# Patient Record
Sex: Female | Born: 1960 | Race: White | Hispanic: No | Marital: Married | Smoking: Former smoker
Health system: Southern US, Community
[De-identification: ages and names within clinical notes are randomized; demographics above are authoritative.]

## PROBLEM LIST (undated history)

## (undated) DIAGNOSIS — G2581 Restless legs syndrome: Secondary | ICD-10-CM

## (undated) DIAGNOSIS — G479 Sleep disorder, unspecified: Secondary | ICD-10-CM

## (undated) DIAGNOSIS — I10 Essential (primary) hypertension: Secondary | ICD-10-CM

## (undated) DIAGNOSIS — E119 Type 2 diabetes mellitus without complications: Secondary | ICD-10-CM

## (undated) DIAGNOSIS — Z8679 Personal history of other diseases of the circulatory system: Secondary | ICD-10-CM

## (undated) DIAGNOSIS — T8859XA Other complications of anesthesia, initial encounter: Secondary | ICD-10-CM

## (undated) DIAGNOSIS — K760 Fatty (change of) liver, not elsewhere classified: Secondary | ICD-10-CM

## (undated) DIAGNOSIS — F32A Depression, unspecified: Secondary | ICD-10-CM

## (undated) DIAGNOSIS — Z9889 Other specified postprocedural states: Secondary | ICD-10-CM

## (undated) DIAGNOSIS — N200 Calculus of kidney: Secondary | ICD-10-CM

## (undated) DIAGNOSIS — K219 Gastro-esophageal reflux disease without esophagitis: Secondary | ICD-10-CM

## (undated) DIAGNOSIS — Z8742 Personal history of other diseases of the female genital tract: Secondary | ICD-10-CM

## (undated) DIAGNOSIS — T4145XA Adverse effect of unspecified anesthetic, initial encounter: Secondary | ICD-10-CM

## (undated) DIAGNOSIS — Q829 Congenital malformation of skin, unspecified: Secondary | ICD-10-CM

## (undated) DIAGNOSIS — IMO0002 Reserved for concepts with insufficient information to code with codable children: Secondary | ICD-10-CM

## (undated) DIAGNOSIS — F419 Anxiety disorder, unspecified: Secondary | ICD-10-CM

## (undated) DIAGNOSIS — R112 Nausea with vomiting, unspecified: Secondary | ICD-10-CM

## (undated) DIAGNOSIS — F329 Major depressive disorder, single episode, unspecified: Secondary | ICD-10-CM

## (undated) HISTORY — DX: Personal history of other diseases of the circulatory system: Z86.79

## (undated) HISTORY — PX: TUBAL LIGATION: SHX77

## (undated) HISTORY — PX: CHOLECYSTECTOMY: SHX55

## (undated) HISTORY — PX: NECK SURGERY: SHX720

## (undated) HISTORY — PX: ANKLE SURGERY: SHX546

## (undated) HISTORY — DX: Type 2 diabetes mellitus without complications: E11.9

## (undated) HISTORY — PX: CERVICAL FUSION: SHX112

## (undated) HISTORY — PX: LEG SURGERY: SHX1003

## (undated) HISTORY — PX: BREAST ENHANCEMENT SURGERY: SHX7

## (undated) HISTORY — DX: Reserved for concepts with insufficient information to code with codable children: IMO0002

## (undated) HISTORY — DX: Essential (primary) hypertension: I10

## (undated) HISTORY — DX: Restless legs syndrome: G25.81

## (undated) HISTORY — PX: CARPAL TUNNEL RELEASE: SHX101

## (undated) HISTORY — DX: Personal history of other diseases of the female genital tract: Z87.42

## (undated) HISTORY — DX: Fatty (change of) liver, not elsewhere classified: K76.0

## (undated) HISTORY — PX: WRIST SURGERY: SHX841

---

## 1998-11-10 ENCOUNTER — Emergency Department (HOSPITAL_COMMUNITY): Admission: EM | Admit: 1998-11-10 | Discharge: 1998-11-10 | Payer: Self-pay | Admitting: Emergency Medicine

## 2000-05-16 ENCOUNTER — Encounter: Payer: Self-pay | Admitting: Emergency Medicine

## 2000-05-16 ENCOUNTER — Emergency Department (HOSPITAL_COMMUNITY): Admission: EM | Admit: 2000-05-16 | Discharge: 2000-05-17 | Payer: Self-pay | Admitting: Emergency Medicine

## 2002-07-29 ENCOUNTER — Encounter: Payer: Self-pay | Admitting: Orthopedic Surgery

## 2002-07-29 ENCOUNTER — Inpatient Hospital Stay (HOSPITAL_COMMUNITY): Admission: EM | Admit: 2002-07-29 | Discharge: 2002-08-01 | Payer: Self-pay | Admitting: Emergency Medicine

## 2002-07-29 ENCOUNTER — Encounter: Payer: Self-pay | Admitting: Emergency Medicine

## 2002-07-30 ENCOUNTER — Encounter: Payer: Self-pay | Admitting: Orthopedic Surgery

## 2003-12-13 ENCOUNTER — Other Ambulatory Visit: Admission: RE | Admit: 2003-12-13 | Discharge: 2003-12-13 | Payer: Self-pay | Admitting: Gynecology

## 2004-02-15 ENCOUNTER — Inpatient Hospital Stay (HOSPITAL_COMMUNITY): Admission: EM | Admit: 2004-02-15 | Discharge: 2004-02-16 | Payer: Self-pay | Admitting: Emergency Medicine

## 2008-09-25 ENCOUNTER — Ambulatory Visit (HOSPITAL_COMMUNITY): Admission: RE | Admit: 2008-09-25 | Discharge: 2008-09-28 | Payer: Self-pay | Admitting: Neurosurgery

## 2008-10-15 ENCOUNTER — Encounter: Admission: RE | Admit: 2008-10-15 | Discharge: 2008-10-15 | Payer: Self-pay | Admitting: Neurosurgery

## 2008-11-06 ENCOUNTER — Encounter: Admission: RE | Admit: 2008-11-06 | Discharge: 2008-11-06 | Payer: Self-pay | Admitting: Neurosurgery

## 2008-11-26 ENCOUNTER — Encounter: Admission: RE | Admit: 2008-11-26 | Discharge: 2008-11-26 | Payer: Self-pay | Admitting: Neurosurgery

## 2008-12-07 ENCOUNTER — Ambulatory Visit (HOSPITAL_COMMUNITY): Admission: RE | Admit: 2008-12-07 | Discharge: 2008-12-08 | Payer: Self-pay | Admitting: Neurosurgery

## 2008-12-26 ENCOUNTER — Encounter: Admission: RE | Admit: 2008-12-26 | Discharge: 2008-12-26 | Payer: Self-pay | Admitting: Neurosurgery

## 2009-08-26 IMAGING — RF DG CERVICAL SPINE 1V
1 series · 2 of 2 positions shown · non-contrast
Comparison: Cervical spine radiographs 02/16/2004.

CLINICAL DATA: C5-C6, C6-C7 ACDF.

CERVICAL SPINE - 1 VIEW

[Series 1: run · 2 of 2 slices shown]
[im 1/2]
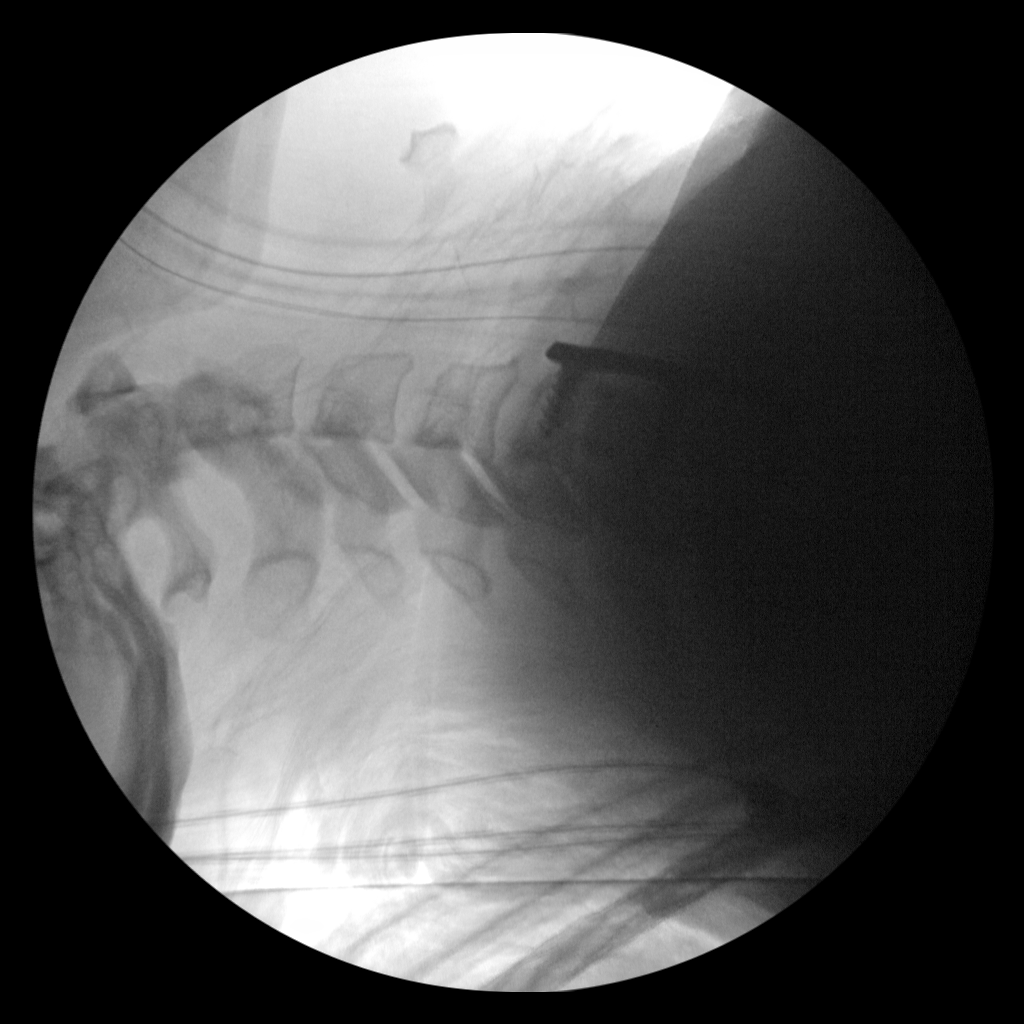
[im 2/2]
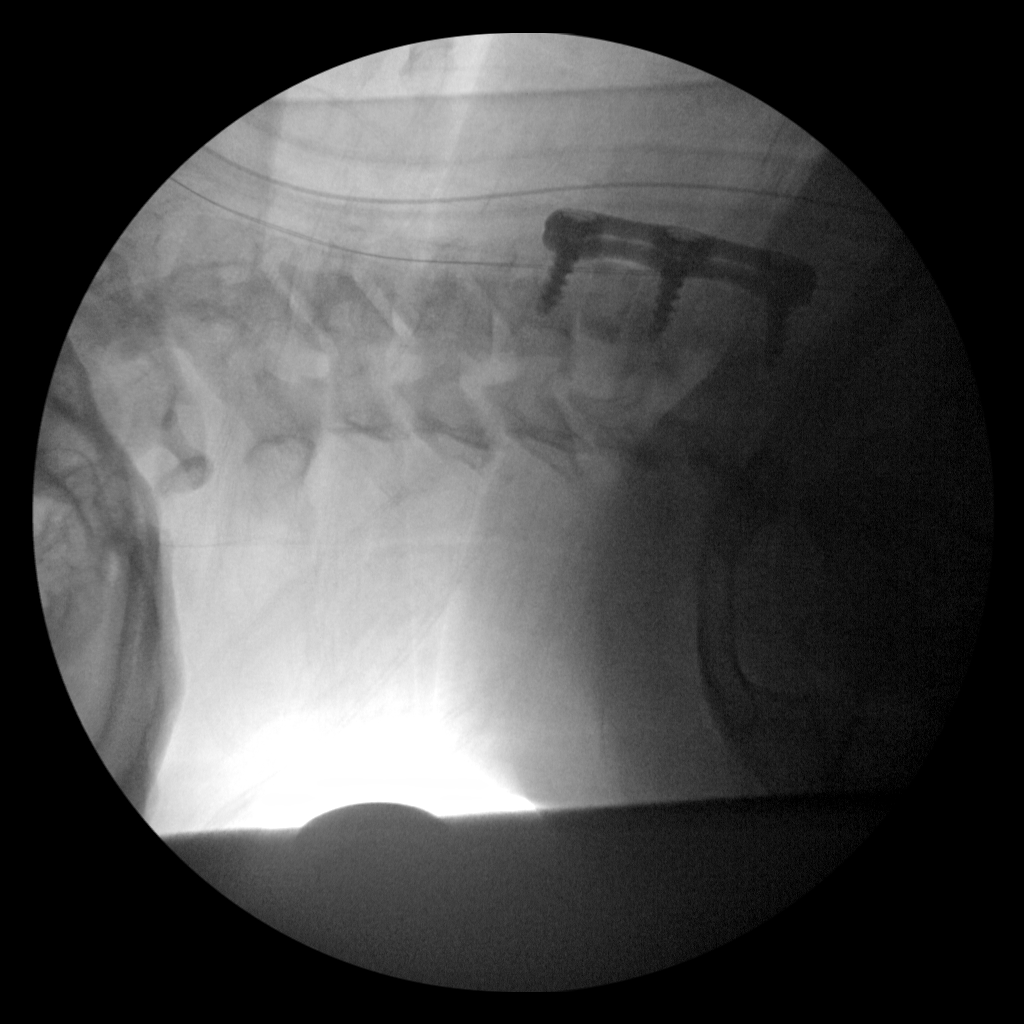

[2 of 2 positions shown; findings below may reference images not displayed]

FINDINGS: Two cross-table lateral views are submitted
postoperatively from the operating room. These demonstrate anterior
discectomy and fusion from C5-C7 with an anterior plate and screws
and intervertebral bone plugs.  The hardware appears well
positioned.  No complications are evident.
IMPRESSION: Intraoperative views following C5-C7 ACDF.  No demonstrated
complication.

## 2010-12-07 ENCOUNTER — Encounter: Payer: Self-pay | Admitting: Family Medicine

## 2011-03-02 LAB — BASIC METABOLIC PANEL
CO2: 28 mEq/L (ref 19–32)
Calcium: 10.2 mg/dL (ref 8.4–10.5)
Creatinine, Ser: 0.69 mg/dL (ref 0.4–1.2)
GFR calc Af Amer: >60 mL/min (ref >60)
GFR calc non Af Amer: >60 mL/min (ref >60)

## 2011-03-02 LAB — CBC
MCHC: 34 g/dL (ref 30.0–36.0)
RBC: 4.6 MIL/uL (ref 3.87–5.11)

## 2011-03-31 NOTE — Op Note (Signed)
[name], [name] NO.:  [id number]   MEDICAL RECORD NO.:  [id number]          PATIENT TYPE:  OIB   LOCATION:  3534                         FACILITY:  [facility]   PHYSICIAN:  Reinaldo Meeker, M.D. DATE OF BIRTH:  1961-02-09   DATE OF PROCEDURE:  12/07/2008  DATE OF DISCHARGE:                               OPERATIVE REPORT   PREOPERATIVE DIAGNOSIS:  Recurrent right C7 radiculopathy.   POSTOPERATIVE DIAGNOSIS:  Recurrent right C7 radiculopathy.   PROCEDURE:  Removal of anterior cervical hardware C5-6, C6-7 with  evaluation of C5-6, C6-7 anterior cervical fusion, redo C6-7 anterior  cervical diskectomy with fusion at C6-7 followed by replacement of C5-6,  C6-7 anterior cervical plating with Helix anterior cervical plate.   SURGEON:  Reinaldo Meeker, MD   ASSISTANT:  Kathaleen Maser. Pool, MD   SURGICAL INDICATIONS:  [name] is a 50 year old female who is  approximately 2 months status post anterior cervical diskectomy with  fusion and plating C5-6, C6-7.  Immediately after surgery, she had a  severe left dysesthetic pain, and an MRI scan done at that time showed a  spinal cord contusion behind the C5-6 interspace.  At that time, the  foramen bilaterally looked good.  Overtime, her dysesthetic left upper  extremity pain has improved, but over the last few weeks she developed  fairly significant right upper extremity pain.  She was evaluated with  an MRI scan and myelogram and CT scan which showed recurrent compression  of the C7 nerve root on the right.  It was therefore elected to be re-  explore the C6-7 fusion at this time and evaluate the C7 nerve root for  further decompression.  The patient understands the risks and benefits  of surgical intervention including bleeding, infection, weakness,  numbness, paralysis, spinal cord leak, trouble with the instrumentation,  nonunion, persistent pain, coma, and death.  We also discussed the  possibility of hoarseness.  We  discussed the option of doing another  intervention.  She has had the opportunity to ask numerous questions and  appears to understand.  With this information at hand, she has requested  to proceed with surgery and she was brought in the hospital at this time  for a surgical procedure.   PROCEDURE IN DETAIL:  After placing in the supine position under 5  pounds halter traction, the patient's neck was prepped and draped in the  usual sterile fashion.  Previous cervical incision was reopened.  Natural fascial plane between the strap muscles medially and  sternocleidomastoid laterally was identified and followed down to the  anterior aspect of the cervical spine.  Longus coli muscles were  identified, split in the midline.  Soft tissue over the plate was  dissected free, and the plate was cleared with unipolar coagulation.  Using the locking device, the screws were removed from the anterior  cervical plate.  The plate was removed without difficulty.  A disk space  spreader was used at C6-7 to help loosen the bone plug, but was already  fairly attached vertically to the C6 vertebral body.  We  therefore  elected to drill it out.  This was done without difficulty.  C5-6 fusion  was found to be quite solid at this time.  The microscope was draped,  brought into the field, and used for the remainder of the case.  A small  rim of cortical bone deep was removed as well as some soft tissue along  the spinal dura.  Exploration of the C6-7 foramen yielded some soft  tissue granulation tissue as well as some looseness of the uncovertebral  process, and these were all removed until the C7 nerve root was  completely decompressed and visualized.  Spinal dura was decompressed.  We did not explore the left C7 foramen.  At this time, measurements were  taken and the bone bank plugs reconstituted.  The area was irrigated  once more and confirming hemostasis.  The plugs were passed off without  difficulty.   Fluoroscopy showed to be in excellent position.  The  previous anterior cervical plate was then just replaced.  Rescue screws  were placed, one on the top and one on the bottom and then the other  screws were just standard fixed angle screws.  The locking mechanism was  secured bilaterally at all 3 levels.  Final fluoroscopy showed the plate  and screws and plug to be in good position.  Large amounts of irrigation  were carried out.  Any bleeding controlled with bipolar coagulation and  Gelfoam.  Gelfoam was removed, and the wound closed with interrupted  Vicryl on the platysma muscle and inverted 5-0 PDS on the subcuticular  layer and Steri-Strips on the skin.  Sterile dressing and soft collar  were applied.  The patient was extubated and taken to recovery room in  stable condition.           ______________________________  Reinaldo Meeker, M.D.     ROK/MEDQ  D:  12/07/2008  T:  12/07/2008  Job:  [id number]

## 2011-03-31 NOTE — Op Note (Signed)
[name], [name] NO.:  [id number]   MEDICAL RECORD NO.:  [id number]          PATIENT TYPE:  INP   LOCATION:  2899                         FACILITY:  [facility]   PHYSICIAN:  Reinaldo Meeker, M.D. DATE OF BIRTH:  04-10-1961   DATE OF PROCEDURE:  09/25/2008  DATE OF DISCHARGE:                               OPERATIVE REPORT   PREOPERATIVE DIAGNOSIS:  Herniated disk, C5-C6, C6-C7, right.   POSTOPERATIVE DIAGNOSIS:  Herniated disk, C5-C6, C6-C7, right.   PROCEDURE:  C5-C6, C6-C7 anterior cervical diskectomy with bone-bank  fusion followed by Helix anterior cervical plating.   SURGEON:  Reinaldo Meeker, MD   ASSISTANT:  Kathaleen Maser. Pool, MD   PROCEDURE IN DETAIL:  After being placed in a supine position in 5-pound  halter traction, the patient's neck was prepped and draped in the usual  sterile fashion.  Localizing fluoroscopy was used prior to incision to  identify the appropriate level.  Transverse incision was made in the  right anterior neck starting at the midline and headed towards the  medial aspect of the sternocleidomastoid muscle.  The platysma muscle  was incised transversely.  The natural fascial plane between the strap  muscles medially and sternocleidomastoid laterally was identified and  followed down to the anterior aspect of the cervical spine.  Longus  colli muscle were identified, split in the midline, stripped away  bilaterally with Tour[employer] information centre manager.  Self-retaining  retractor was placed for exposure.  An x-ray showed the approach to be  at appropriate level.  Using the #15 blade, the disk at C5-C6 and C6-C7  was incised.  Using pituitary rongeurs and curettes, approximately 90%  of disk material was removed.  High-speed drill was used to widen the  interspace.  Microscope was draped, brought into field and used for  remainder of the case.  Starting at C5-C6, the remainder of the disk  material on the posterior longitudinal  ligament was removed.  The  ligament was then incised transversely and cut edges were removed with  Kerrison punch.  Bony overgrowth and herniated disk material along the  C5-C6 level was removed to decompress the spinal dura.  Proximal  foraminal decompression was carried out bilaterally and even more  decompression was carried on the patient's right symptomatic side.  At  this time, inspection was carried out at this level for any evidence of  residual compression, none could be identified.  Attention was then  turned to C6-C7 where similar procedure was carried out.  Once again,  residual bone and herniated disk material were removed to decompress the  spinal dura and proximal foramen bilaterally.  Once again, aggressive  decompression was carried out towards the right symptomatic side.  At  this time, inspection was carried out at both levels for any evidence of  residual compression, none could be identified.  At this time, large  amounts of irrigation were carried out.  Any bleeding was controlled  with bipolar coagulation and Gelfoam.  Measurements were taken and an 8-  mm bone-bank plug was reconstituted and packed at C5-C6  and a 7-mm plug  was impacted at C6-C7.  Fluoroscopy showed them to be in excellent  position.  Large amounts of irrigation were carried out.  A 42-mm Helix  anterior cervical plate was then chosen.  Under fluoroscopic guidance,  drill holes were placed followed by placement of 13-mm screws x6 to the  locking mechanism and was secured at all 3 levels bilaterally.  Final  fluoroscopy showed the plate, screws, and plugs to all be in good  position.  Large amounts of irrigation were carried out once more and  any bleeding was controlled with bipolar coagulation.  The wound was  then closed with interrupted Vicryl on the platysma muscle, inverted 5-0  PDS in the subcuticular layer, and Steri-Strips on the skin.  A sterile  dressing and soft collar were applied and  the patient was extubated and  taken to the recovery room in stable condition.           ______________________________  Reinaldo Meeker, M.D.     ROK/MEDQ  D:  09/25/2008  T:  09/25/2008  Job:  [id number]

## 2011-04-03 NOTE — H&P (Signed)
[name], [name]                          ACCOUNT NO.:  [id number]   MEDICAL RECORD NO.:  [id number]                   PATIENT TYPE:  INP   LOCATION:  1610                                 FACILITY[facility]   PHYSICIAN:  Renato Battles, M.D.                  DATE OF BIRTH:  02/26/1961   DATE OF ADMISSION:  02/14/2004  DATE OF DISCHARGE:                                HISTORY & PHYSICAL   REASON FOR ADMISSION:  Altered mental status.   HISTORY OF PRESENT ILLNESS:  The patient is a 50 year old white female who  has just lost her job today.  She went out with her friends to get some  distraction.  She drank alcohol excessively.  Her friends called her husband  and claimed that she had a seizure.  They later denied that she had any  seizure but she was very combative and confused.  She was given Geodon and  Narcan after which she was very sedated and very hard to wake up, and even  when she was awakened she was very combative and gave no history.  Her blood  pressure was noted to be low and dropped from 100 at arrival to 80s later on  so IV fluids were started.  Her husband denies any suicidal ideation or  depression.  She is taking one medication called Micardis.  However, she  shares that medication with her husband and it is very difficult to know how  many tablets are supposed to be remaining.  Her husband reports that she had  similar event before and she was admitted to this hospital with the same  problem, drinking excessively and being confused and out of it later on.   REVIEW OF SYSTEMS:  Obtained from the husband, denies any other complaints.  Was only positive for history of binge drinking and history of snoring  without apnea.   PAST MEDICAL HISTORY:  1. Hypertension.  2. Anxiety.   PAST SURGICAL HISTORY:  1. Ankle surgery secondary to fracture.  2. Cholecystectomy.   FAMILY HISTORY:  Positive for coronary artery disease.   SOCIAL HISTORY:  Positive for binge  drinking.  Tobacco abuse - one-half pack  a day for the last 25 years.  Occasional cocaine smoking, no IV.   ALLERGIES:  CODEINE.   MEDICATIONS:  Micardis and Effexor - unknown doses.   PHYSICAL EXAMINATION:  GENERAL:  She is very drowsy, snoring, very difficult  to wake up, and when she starts to wake up she is combative, trying to pull  off her IV line and tried to get off the bed.  VITAL SIGNS:  Afebrile, blood pressure at this 80/40, O2 saturation 95% on 2  L oxygen nasal cannula, respiratory rate 14, heart rate fluctuates between  95 and 110.  HEENT:  The head is atraumatic and normocephalic.  Pupils are equal, round,  and react to light and  accommodation.  Could not examine extraocular  movements.  NECK:  No lymphadenopathy, no thyromegaly, no JVD.  CHEST:  Clear to auscultation bilaterally, no wheezing.  HEART:  Regular rhythm, tachycardia.  ABDOMEN:  Soft, nontender, nondistended, normal active bowel sounds.  EXTREMITIES:  No cyanosis or clubbing.  There is 1+ lower extremity edema  bilaterally.   STUDIES:  CBC showed hemoglobin of 14, white count of 14.4, with 51%  neutrophils, normal platelets.   Electrolytes showed low potassium at 3.1, normal kidney function; everything  else was normal.  Urinalysis was negative.  Urine drug screen was positive  for benzodiazepine.   Alcohol level was very high at 218.  Pregnancy test was negative.  Liver  functions were within normal limits.  ABG initially showed pH of 7.251 with  PCO2 of 51 and PO2 of 136.  Repeat showed a pH of 7.23 with PCO2 of 48 and  PO2 of 110.  EKG first set showed suspicious inferior ischemia.  The second  set was nonspecific.  Cardiac enzymes were all negative the first set.   ASSESSMENT AND PLAN:  1. Altered mental status.  This is most likely secondary to alcoholism and     alcoholic intoxication, questionable intake of benzodiazepines on top of     alcohol.  I going to monitor the patient in the  intensive care unit.  At     this point there is no need for any antidote.  We expect the patient to     recover within 24 hours.  2. Alcoholism.  The patient will be given thiamine and folate.  I am going     to check magnesium and phosphate level and replenish potassium.  3. Abnormal electrocardiogram.  Again, I doubt that this represented true     cardiac ischemia.  However, she has multiple risk factors including her     family history, her smoking and alcohol history, her body habitus - she     is overweight.  Given these factors I will start her on Lovenox and     aspirin. I am going to check cardiac enzymes and lipids and monitor her     EKG.  4. Hypotension.  Cannot rule out a Micardis overdose although there are no     indications of it from her history.  The patient received 2 L of IV     fluids in the ER.  I am going to continue with IV fluids and vasopressors     will be reserved to any worsening of blood pressure.  5. Stool is guaiac positive. Currently her hemoglobin and hematocrit are     showing to be normal.  This may change with IV hydration.  I have no     indication of GI bleed at this point with normal BUN and creatinine.  I     am going to just monitor her hemoglobin and hematocrit for the next 24     hours.   PRIMARY CARE PHYSICIAN:  Soyla Murphy. Renne Crigler, M.D.                                               Renato Battles, M.D.    SA/MEDQ  D:  02/15/2004  T:  02/15/2004  Job:  [id number]

## 2011-04-03 NOTE — Op Note (Signed)
NAMEROZLYN, YERBY                          ACCOUNT NO.:  [id number]   MEDICAL RECORD NO.:  [id number]                   PATIENT TYPE:  INP   LOCATION:  0483                                 FACILITY:  [facility]   PHYSICIAN:  Almedia Balls. Ranell Patrick, M.D.              DATE OF BIRTH:  09/05/1961   DATE OF PROCEDURE:  DATE OF DISCHARGE:                                 OPERATIVE REPORT   PREOPERATIVE DIAGNOSIS:  Displaced trimalleolar left ankle fracture.   POSTOPERATIVE DIAGNOSIS:  Displaced trimalleolar left ankle fracture.   PROCEDURE:  Open reduction and internal fixation left trimalleolar ankle  fracture.   SURGEON:  Almedia Balls. Ranell Patrick, M.D.   ANESTHESIA:  General anesthesia.   ESTIMATED BLOOD LOSS:  None.   TOURNIQUET TIME:  1 hour.   INSTRUMENT COUNT:  Correct.   COMPLICATIONS:  None.   ANTIBIOTICS:  None.   INDICATIONS:  The patient is a 50 year old female who sustained a fall,  twisting her left ankle two days ago.  The patient was unable to ambulate  after the injury and complained of immediate pain on both medial and lateral  aspects of the ankle.  The patient did have obvious deformity on presenting  to the emergency room.  X-rays demonstrated displaced trimalleolar ankle  fracture.  At this point, it was recommended that the patient have surgery  to stabilize the ankle.  Informed consent was obtained.   DESCRIPTION OF PROCEDURE:  After an adequate level of anesthesia was  achieved and 1 g of Ancef was given preoperatively, the patient was placed  supine on the operating table.  An sterile tourniquet was placed on the left  proximal thigh.  The left leg was then prepped and draped in the usual  sterile fashion.  After exsanguination of the leg using Esmarch bandage, the  tourniquet was elevated to 275 mmHg.  A longitudinal skin incision was  created overlying the fibula.  This was taken sharply down to the periosteum  which was then incised in line with the skin  incision.  Subperiosteal  dissection of the fibular was performed.  The fracture site was irrigated  and debrided of loose soft tissue.  The fracture was anatomically reduced  and held reduced with a clamp while a seven hold one-third tibial plate was  applied to the lateral aspect of the fibula.  This was secured proximally  and distally with three bicortical screws.  Excellent stability of the  fracture was noted at this point.  Attention was directed first to the  medial aspect of the ankle.  A curvilinear incision was created directly  overlying the medial malleolus.  Dissection was carried sharply down to the  medial malleolus which was then displaced and inspection of the medial  aspect of the ankle was performed.  There was noted to be talar lesions.  Thorough irrigation of the joint was performed.  Periosteum about the  fracture site was removed.  The fracture was reduced and held with the  tenaculum clamp and two  4-0 malleolar screws were placed in the medial malleolus under fluoroscopy  guidance.  The ankle was then anatomically reduced.  A cotton test was  performed in the distal fibula.  It was noted to be stable indicating a  stable syndesmosis at this point.  The wounds were closed using 2-0 Vicryl  and staples.  A sterile dressing was applied followed by a short leg splint.  The patient was taken to the recovery room in stable condition.                                               Almedia Balls. Ranell Patrick, M.D.    SRN/MEDQ  D:  07/30/2002  T:  07/30/2002  Job:  [id number]

## 2011-04-03 NOTE — Discharge Summary (Signed)
NAMEMEAH, [name]                          ACCOUNT NO.:  [id number]   MEDICAL RECORD NO.:  [id number]                   PATIENT TYPE:  INP   LOCATION:  0356                                 FACILITY:  [facility]   PHYSICIAN:  Ara D. Tammi Klippel, M.D.                DATE OF BIRTH:  25-Oct-1961   DATE OF ADMISSION:  02/14/2004  DATE OF DISCHARGE:  02/16/2004                                 DISCHARGE SUMMARY   DISCHARGING ATTENDING:  Dr. Tammi Klippel.   PRIMARY CARE PHYSICIAN:  Dr. Sonda Primes. Pharr.   FINAL DIAGNOSES:  1. Acute alcohol intoxication.  2. Hypertension.  3. Depression.  4. Ankle surgery secondary to fracture.  5. Status post cholecystectomy.   FINAL PROCEDURES:  1. Continuous telemetry monitoring showing only sinus rhythm and no acute     arrhythmia's.  2. Serial EKGs showing no ST segment depression or elevation, no T-wave     changes, and development of no pathologic Q waves.   PERTINENT LABORATORY DATA AND OTHER TEST RESULTS:  Serial cardiac enzymes  negative x3.  H&H at 12.8/37 from a maximum of 14.3 and 9.8 with an MCV of  91.2.  Sodium 137, potassium 3.4, chloride 108, carbon dioxide 25, BUN 5,  creatinine 0.7, glucose 92, calcium of 8.6.  Cholesterol 176, triglycerides  243, HDL 20, LDL 17.  Beta hCG negative.  Urine toxicity screen positive for  benzodiazepines, alcohol level at 218 mg/dL on admission.  Fecal occult  blood test still pending as of this date.   SUMMARY OF HOSPITAL COURSE:  The patient is a very pleasant 50 year old  white female with Past Medical History as listed above who was brought in  after having an alcohol binge after losing her job.  Although the patient  did have benzodiazepines in her urine, she adamantly denied any intentional  ingestion.  On the day of discharge, the patient adamantly denies any  suicidal attempts was planned, any suicidal ideation, or homicidal ideation.  She was seen by psychiatry consult who felt that she did have a  depressive  disorder not otherwise specified and that they agreed with the plan to  discharge her with outpatient follow up at [facility] or [facility].  The patient was restarted on her venlafaxine and the patient  had no signs or symptoms of withdrawal from this medication.  The patient  did have one episode of guaiac-positive stools; however, her H&H remained  stable.  There was no acute signs or symptoms of any GI bleed and this will  be further followed up as an outpatient once her acute issues have resolved.   DISCHARGE MEDICATIONS:  1. Venlafaxine ER 150 mg p.o. daily.  2. ___________ Ethelda Chick.  The patient is to resume her prior home dose.   DISCHARGE INSTRUCTIONS:  1. The patient is to take her medications as prescribed.  2. She is to stop abusing alcohol.  3. She is to follow up with [facility] or [facility].  4. She is to follow up with Dr. Renne Crigler in approximately 1 to 2 weeks.                                               Ara D. Tammi Klippel, M.D.    ADM/MEDQ  D:  02/16/2004  T:  02/17/2004  Job:  [id number]   cc:   Soyla Murphy. Renne Crigler, M.D.  [address] [place] [address]  [place]  [place] [address]  Fax: [phone]
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
## 2011-08-12 ENCOUNTER — Other Ambulatory Visit: Payer: Self-pay | Admitting: Unknown Physician Specialty

## 2011-08-12 DIAGNOSIS — E049 Nontoxic goiter, unspecified: Secondary | ICD-10-CM

## 2011-08-12 DIAGNOSIS — E010 Iodine-deficiency related diffuse (endemic) goiter: Secondary | ICD-10-CM

## 2011-08-13 ENCOUNTER — Other Ambulatory Visit: Payer: Self-pay | Admitting: Unknown Physician Specialty

## 2011-08-13 ENCOUNTER — Ambulatory Visit
Admission: RE | Admit: 2011-08-13 | Discharge: 2011-08-13 | Disposition: A | Discharge status: Home / Self Care | Payer: Self-pay | Source: Ambulatory Visit | Attending: Unknown Physician Specialty | Admitting: Unknown Physician Specialty

## 2011-08-13 DIAGNOSIS — Z1231 Encounter for screening mammogram for malignant neoplasm of breast: Secondary | ICD-10-CM

## 2011-08-13 DIAGNOSIS — E010 Iodine-deficiency related diffuse (endemic) goiter: Secondary | ICD-10-CM

## 2011-08-19 LAB — BASIC METABOLIC PANEL
CO2: 29
Calcium: 9.9
GFR calc Af Amer: >60
GFR calc non Af Amer: >60
Potassium: 4.7
Sodium: 144

## 2011-08-19 LAB — CBC
Hemoglobin: 14.8
MCHC: 34.8
RBC: 4.58

## 2011-09-08 ENCOUNTER — Ambulatory Visit: Payer: Self-pay

## 2012-03-14 ENCOUNTER — Other Ambulatory Visit: Payer: Self-pay | Admitting: Family Medicine

## 2012-03-14 DIAGNOSIS — E041 Nontoxic single thyroid nodule: Secondary | ICD-10-CM

## 2012-03-16 ENCOUNTER — Inpatient Hospital Stay: Admission: RE | Admit: 2012-03-16 | Payer: 59 | Source: Ambulatory Visit

## 2012-03-23 ENCOUNTER — Encounter: Payer: Self-pay | Admitting: *Deleted

## 2012-03-23 DIAGNOSIS — Z8679 Personal history of other diseases of the circulatory system: Secondary | ICD-10-CM | POA: Insufficient documentation

## 2012-03-23 DIAGNOSIS — IMO0002 Reserved for concepts with insufficient information to code with codable children: Secondary | ICD-10-CM | POA: Insufficient documentation

## 2012-07-26 ENCOUNTER — Encounter: Payer: Self-pay | Admitting: Cardiology

## 2014-12-10 ENCOUNTER — Encounter (HOSPITAL_COMMUNITY): Payer: Self-pay | Admitting: *Deleted

## 2014-12-10 ENCOUNTER — Emergency Department (HOSPITAL_COMMUNITY): Payer: 59

## 2014-12-10 ENCOUNTER — Telehealth: Payer: Self-pay | Admitting: *Deleted

## 2014-12-10 ENCOUNTER — Emergency Department (HOSPITAL_COMMUNITY)
Admission: EM | Admit: 2014-12-10 | Discharge: 2014-12-10 | Disposition: A | Discharge status: Home / Self Care | Payer: 59 | Attending: Emergency Medicine | Admitting: Emergency Medicine

## 2014-12-10 DIAGNOSIS — R109 Unspecified abdominal pain: Secondary | ICD-10-CM | POA: Diagnosis present

## 2014-12-10 DIAGNOSIS — R630 Anorexia: Secondary | ICD-10-CM | POA: Insufficient documentation

## 2014-12-10 DIAGNOSIS — Z8739 Personal history of other diseases of the musculoskeletal system and connective tissue: Secondary | ICD-10-CM | POA: Insufficient documentation

## 2014-12-10 DIAGNOSIS — R112 Nausea with vomiting, unspecified: Secondary | ICD-10-CM | POA: Diagnosis not present

## 2014-12-10 DIAGNOSIS — N200 Calculus of kidney: Secondary | ICD-10-CM | POA: Diagnosis not present

## 2014-12-10 DIAGNOSIS — Z79899 Other long term (current) drug therapy: Secondary | ICD-10-CM | POA: Insufficient documentation

## 2014-12-10 DIAGNOSIS — I10 Essential (primary) hypertension: Secondary | ICD-10-CM | POA: Diagnosis not present

## 2014-12-10 DIAGNOSIS — Z87891 Personal history of nicotine dependence: Secondary | ICD-10-CM | POA: Insufficient documentation

## 2014-12-10 LAB — CBC WITH DIFFERENTIAL/PLATELET
BASOS PCT: 1 % (ref 0–1)
Basophils Absolute: 0.1 10*3/uL (ref 0.0–0.1)
EOS ABS: 0.1 10*3/uL (ref 0.0–0.7)
Eosinophils Relative: 1 % (ref 0–5)
HCT: 45.2 % (ref 36.0–46.0)
HEMOGLOBIN: 16.3 g/dL — AB (ref 12.0–15.0)
Lymphocytes Relative: 22 % (ref 12–46)
Lymphs Abs: 1.9 10*3/uL (ref 0.7–4.0)
MCH: 32.4 pg (ref 26.0–34.0)
MCHC: 36.1 g/dL — ABNORMAL HIGH (ref 30.0–36.0)
MCV: 89.9 fL (ref 78.0–100.0)
MONOS PCT: 5 % (ref 3–12)
Monocytes Absolute: 0.4 10*3/uL (ref 0.1–1.0)
NEUTROS PCT: 71 % (ref 43–77)
Neutro Abs: 6.2 10*3/uL (ref 1.7–7.7)
Platelets: 284 10*3/uL (ref 150–400)
RBC: 5.03 MIL/uL (ref 3.87–5.11)
RDW: 11.9 % (ref 11.5–15.5)
WBC: 8.6 10*3/uL (ref 4.0–10.5)

## 2014-12-10 LAB — COMPREHENSIVE METABOLIC PANEL
ALBUMIN: 4.3 g/dL (ref 3.5–5.2)
ALT: 35 U/L (ref 0–35)
ANION GAP: 10 (ref 5–15)
AST: 31 U/L (ref 0–37)
Alkaline Phosphatase: 71 U/L (ref 39–117)
BUN: 12 mg/dL (ref 6–23)
CO2: 24 mmol/L (ref 19–32)
CREATININE: 0.93 mg/dL (ref 0.50–1.10)
Calcium: 9.6 mg/dL (ref 8.4–10.5)
Chloride: 104 mmol/L (ref 96–112)
GFR calc Af Amer: 81 mL/min — ABNORMAL LOW (ref >90)
GFR calc non Af Amer: 70 mL/min — ABNORMAL LOW (ref >90)
Glucose, Bld: 143 mg/dL — ABNORMAL HIGH (ref 70–99)
POTASSIUM: 4 mmol/L (ref 3.5–5.1)
SODIUM: 138 mmol/L (ref 135–145)
TOTAL PROTEIN: 8.1 g/dL (ref 6.0–8.3)
Total Bilirubin: 0.9 mg/dL (ref 0.3–1.2)

## 2014-12-10 LAB — URINE MICROSCOPIC-ADD ON

## 2014-12-10 LAB — URINALYSIS, ROUTINE W REFLEX MICROSCOPIC
Bilirubin Urine: NEGATIVE
GLUCOSE, UA: NEGATIVE mg/dL
Ketones, ur: NEGATIVE mg/dL
Nitrite: NEGATIVE
PH: 5.5 (ref 5.0–8.0)
PROTEIN: NEGATIVE mg/dL
Specific Gravity, Urine: 1.021 (ref 1.005–1.030)
Urobilinogen, UA: 0.2 mg/dL (ref 0.0–1.0)

## 2014-12-10 LAB — LIPASE, BLOOD: Lipase: 29 U/L (ref 11–59)

## 2014-12-10 MED ORDER — KETOROLAC TROMETHAMINE 15 MG/ML IJ SOLN
15.0000 mg | Freq: Once | INTRAMUSCULAR | Status: AC
  Administered 2014-12-10: 15 mg via INTRAVENOUS
  Filled 2014-12-10: qty 1

## 2014-12-10 MED ORDER — HYDROMORPHONE HCL 1 MG/ML IJ SOLN
1.0000 mg | Freq: Once | INTRAMUSCULAR | Status: DC
  Filled 2014-12-10: qty 1

## 2014-12-10 MED ORDER — HYDROCODONE-ACETAMINOPHEN 5-325 MG PO TABS: 1.0000 | ORAL_TABLET | ORAL | Status: DC | PRN

## 2014-12-10 MED ORDER — TRAMADOL HCL 50 MG PO TABS: 50.0000 mg | ORAL_TABLET | Freq: Four times a day (QID) | ORAL | Status: DC | PRN

## 2014-12-10 MED ORDER — HYDROMORPHONE HCL 1 MG/ML IJ SOLN
0.5000 mg | Freq: Once | INTRAMUSCULAR | Status: AC
  Administered 2014-12-10: 0.5 mg via INTRAVENOUS
  Filled 2014-12-10: qty 1

## 2014-12-10 MED ORDER — HYDROMORPHONE HCL 1 MG/ML IJ SOLN
1.0000 mg | Freq: Once | INTRAMUSCULAR | Status: AC
  Administered 2014-12-10: 1 mg via INTRAMUSCULAR

## 2014-12-10 MED ORDER — SODIUM CHLORIDE 0.9 % IV BOLUS (SEPSIS)
500.0000 mL | Freq: Once | INTRAVENOUS | Status: AC
  Administered 2014-12-10: 500 mL via INTRAVENOUS

## 2014-12-10 MED ORDER — ONDANSETRON HCL 4 MG/2ML IJ SOLN
4.0000 mg | Freq: Once | INTRAMUSCULAR | Status: AC
  Administered 2014-12-10: 4 mg via INTRAVENOUS
  Filled 2014-12-10: qty 2

## 2014-12-10 NOTE — Telephone Encounter (Signed)
[facility] called to verify prescription for Ultram.

## 2014-12-10 NOTE — Progress Notes (Signed)
  CARE MANAGEMENT ED NOTE 12/10/2014  Patient:  [name],[name]   Account Number:  [id number]  Date Initiated:  12/10/2014  Documentation initiated by:  Radford PaxFERRERO,Saffron Busey  Subjective/Objective Assessment:   [facility] with abdominal pian and emesis.     Subjective/Objective Assessment Detail:     Action/Plan:   Action/Plan Detail:   Anticipated DC Date:  12/10/2014     Status Recommendation to Physician:   Result of Recommendation:    Other ED Services  Consult Working Plan    DC Planning Services  CM consult  Medication Assistance    Choice offered to / List presented to:            Status of service:  Completed, signed off  ED Comments:   ED Comments Detail:  [facility] call from patient's daughter Joanna Sheldonshley [phone] in regards to Ultram RX.  Patient's daughter reports the patient is very allergic to codiene, "The pharmacy refused to fill the Ultram prescription because it has codiene in it."  [facility] and spoke to EDP Dr. Jodi MourningZavitz who reports patient should be fine with Ultram but if they feel uncomfortable with filling the medication, then try Motrin 800mg  over the counter and follow up with her pcp.  EDCM called patient's daughter Joanna Sheldonshley back at 1723pm and informed her of above.  Patient's daughter agreeable to plan and will have patient follow up with Dr. Sharee PimpleJudge.  No further EDCM needs at this time.

## 2014-12-10 NOTE — ED Notes (Signed)
Pt transported to CT ?

## 2014-12-10 NOTE — ED Notes (Signed)
Pt reports ABD pain and vomiting that started this AM.

## 2014-12-10 NOTE — ED Notes (Signed)
Dr. zavitz back at the bedside.  

## 2014-12-10 NOTE — ED Provider Notes (Signed)
CSN: [id number]     Arrival date & time 12/10/14  1009 History   First MD Initiated Contact with Patient 12/10/14 1021     Chief Complaint  Patient presents with  . Abdominal Pain  . Emesis     (Consider location/radiation/quality/duration/timing/severity/associated sxs/prior Treatment) HPI Comments: 54 year-old female with history of degenerative disc disease presents with worsening left flank pain for the past 24 hours. No history of kidney stone or similar pain. Patient has nausea as well. Pain is fairly constant with intermittent worsening. No injuries to that area. No urinary symptoms.  Patient had her gallbladder removed in the past. Nothing has improved the pain past smoker  Patient is a 54 y.o. female presenting with abdominal pain and vomiting. The history is provided by the patient.  Abdominal Pain Associated symptoms: nausea and vomiting   Associated symptoms: no chest pain, no chills, no dysuria, no fever and no shortness of breath   Emesis Associated symptoms: abdominal pain   Associated symptoms: no chills and no headaches     Past Medical History  Diagnosis Date  . Hypertension   . Degenerative disc disease   . H/O sinus tachycardia    Past Surgical History  Procedure Laterality Date  . Cervical fusion      x 2  . Cholecystectomy    . Tubal ligation    . Wrist surgery      cyst removed right wrist  . Breast enhancement surgery    . Ankle surgery      left  . Leg surgery      left leg   Family History  Problem Relation Age of Onset  . Hypertension Mother   . Diabetes Mother   . Heart disease Father   . Hypertension Father   . Hypertension Sister    History  Substance Use Topics  . Smoking status: Former Smoker    Types: Cigarettes    Quit date: 11/16/2008  . Smokeless tobacco: Not on file  . Alcohol Use: Not on file   OB History    No data available     Review of Systems  Constitutional: Positive for appetite change. Negative for fever and  chills.  HENT: Negative for congestion.   Eyes: Negative for visual disturbance.  Respiratory: Negative for shortness of breath.   Cardiovascular: Negative for chest pain.  Gastrointestinal: Positive for nausea, vomiting and abdominal pain.  Genitourinary: Positive for flank pain. Negative for dysuria.  Musculoskeletal: Negative for back pain, neck pain and neck stiffness.  Skin: Negative for rash.  Neurological: Negative for light-headedness and headaches.      Allergies  Codeine  Home Medications   Prior to Admission medications   Medication Sig Start Date End Date Taking? Authorizing Provider  amitriptyline (ELAVIL) 25 MG tablet Take 25 mg by mouth 3 (three) times daily.    Historical Provider, MD  Cyanocobalamin (VITAMIN B 12 PO) Take by mouth daily.    Historical Provider, MD  cyclobenzaprine (FLEXERIL) 10 MG tablet Take 10 mg by mouth at bedtime.    Historical Provider, MD  HYDROcodone-acetaminophen (NORCO) 5-325 MG per tablet Take 1-2 tablets by mouth every 4 (four) hours as needed. 12/10/14   Enid SkeensJoshua M Rjay Revolorio, MD  Lysine 500 MG CAPS Take by mouth daily.    Historical Provider, MD  telmisartan-hydrochlorothiazide (MICARDIS HCT) 80-12.5 MG per tablet Take 1 tablet by mouth daily.    Historical Provider, MD   BP 121/76 mmHg  Pulse 91  Temp(Src) 98.4  F (36.9 C)  Resp 18  Ht 5' (1.524 m)  Wt 170 lb (77.111 kg)  BMI 33.20 kg/m2  SpO2 94% Physical Exam  Constitutional: She is oriented to person, place, and time. She appears well-developed and well-nourished.  HENT:  Head: Normocephalic and atraumatic.  Eyes: Conjunctivae are normal. Right eye exhibits no discharge. Left eye exhibits no discharge.  Neck: Normal range of motion. Neck supple. No tracheal deviation present.  Cardiovascular: Normal rate and regular rhythm.   Pulmonary/Chest: Effort normal and breath sounds normal.  Abdominal: Soft. She exhibits no distension. There is tenderness (left flank upper quadrant).  There is no guarding.  Musculoskeletal: She exhibits no edema.  Neurological: She is alert and oriented to person, place, and time.  Skin: Skin is warm. No rash noted.  Psychiatric: She has a normal mood and affect.  Nursing note and vitals reviewed.   ED Course  Procedures (including critical care time) Labs Review Labs Reviewed  CBC WITH DIFFERENTIAL/PLATELET - Abnormal; Notable for the following:    Hemoglobin 16.3 (*)    MCHC 36.1 (*)    All other components within normal limits  COMPREHENSIVE METABOLIC PANEL - Abnormal; Notable for the following:    Glucose, Bld 143 (*)    GFR calc non Af Amer 70 (*)    GFR calc Af Amer 81 (*)    All other components within normal limits  URINALYSIS, ROUTINE W REFLEX MICROSCOPIC - Abnormal; Notable for the following:    Hgb urine dipstick MODERATE (*)    Leukocytes, UA SMALL (*)    All other components within normal limits  URINE MICROSCOPIC-ADD ON - Abnormal; Notable for the following:    Squamous Epithelial / LPF FEW (*)    Bacteria, UA FEW (*)    All other components within normal limits  URINE CULTURE  LIPASE, BLOOD    Imaging Review Ct Renal Stone Study  12/10/2014   CLINICAL DATA:  Left flank and umbilical pain.  EXAM: CT ABDOMEN AND PELVIS WITHOUT CONTRAST  TECHNIQUE: Multidetector CT imaging of the abdomen and pelvis was performed following the standard protocol without IV contrast.  COMPARISON:  None.  FINDINGS: Mild dependent atelectasis is seen in the lung bases. No pleural or pericardial effusion.  There is moderate right hydronephrosis due to a 0.5 cm stone at the ureteropelvic junction. Also seen are three right and two left nonobstructing renal stones. The largest stone is in the upper pole of the left kidney and measures 0.6 cm.  The patient is status post cholecystectomy. There is diffuse fatty infiltration of the liver. The adrenal glands, spleen, pancreas and biliary tree are unremarkable. A hypo attenuating lesion in the  left adnexa measures 3.5 cm craniocaudal x 4.8 cm AP x 2.7 cm transverse. The right ovary, uterus and urinary bladder are unremarkable. Scattered colonic diverticular most notable in the descending colon but there is no evidence of diverticulitis. The stomach, small bowel and appendix appear normal. Mild aortoiliac atherosclerosis without aneurysm is noted. There is no lymphadenopathy or fluid. No lytic or sclerotic bony lesion is identified.  IMPRESSION: Moderate left hydronephrosis due to a 0.5 cm stone of the left UPJ. The patient has 2 additional left and source of the patient's additional bilateral nonobstructing renal stones as above.  Diffuse fatty infiltration of the liver.  Cystic left adnexal lesion is probably benign but cannot be definitively characterized. Pelvic ultrasound in 6-12 weeks is recommended for further evaluation. This recommendation follows ACR consensus guidelines: White Paper  of the ACR Incidental Findings Committee II on Adnexal Findings. J Am Coll Radiol [phone].  Mild appearing diverticulosis without diverticulitis.   Electronically Signed   By: Drusilla Kanner M.D.   On: 12/10/2014 13:03     EKG Interpretation None      MDM   Final diagnoses:  Acute left flank pain  Left nephrolithiasis   Patient with left upper quadrant left flank tenderness, discussed differential diagnosis including kidney stone, colitis, kidney infection, other. This is new for patient, CT ordered showing 5 mm stone. Pain improved in the ER however patient did cry multiple pain medicines. CT scan results reviewed. Wishes cystic left adnexal lesion that requires outpatient follow-up and formal ultrasound. Discussed this with the patient.  Outpatient follow up with urology, urine culture sent. Results and differential diagnosis were discussed with the patient/parent/guardian. Close follow up outpatient was discussed, comfortable with the plan.   Medications  HYDROmorphone (DILAUDID)  injection 1 mg (not administered)  ondansetron (ZOFRAN) injection 4 mg (4 mg Intravenous Given 12/10/14 1118)  ketorolac (TORADOL) 15 MG/ML injection 15 mg (15 mg Intravenous Given 12/10/14 1225)  HYDROmorphone (DILAUDID) injection 0.5 mg (0.5 mg Intravenous Given 12/10/14 1233)  sodium chloride 0.9 % bolus 500 mL (0 mLs Intravenous Stopped 12/10/14 1351)    Filed Vitals:   12/10/14 1100 12/10/14 1124 12/10/14 1302 12/10/14 1359  BP: 155/90 155/90 128/77 121/76  Pulse: 89 104 92 91  Temp:      Resp:  Height:      Weight:      SpO2: 94% 96% 95% 94%    Final diagnoses:  Acute left flank pain  Left nephrolithiasis        Enid Skeens, MD 12/10/14 1402

## 2014-12-10 NOTE — Discharge Instructions (Signed)
If you were given medicines take as directed.  If you are on coumadin or contraceptives realize their levels and effectiveness is altered by many different medicines.  If you have any reaction (rash, tongues swelling, other) to the medicines stop taking and see a physician.   Take ibuprofen every 6 hrs for pain. For severe pain take norco or vicodin however realize they have the potential for addiction and it can make you sleepy and has tylenol in it.  No operating machinery while taking.  Please follow up as directed and return to the ER or see a physician for new or worsening symptoms.  Thank you. Filed Vitals:   12/10/14 1023 12/10/14 1100 12/10/14 1124 12/10/14 1302  BP: 152/94 155/90 155/90 128/77  Pulse: 102 89 104 92  Temp: 98.4 F (36.9 C)     Resp: 22  18 18   Height: 5' (1.524 m)     Weight: 170 lb (77.111 kg)     SpO2: 100% 94% 96% 95%

## 2014-12-11 LAB — URINE CULTURE
Colony Count: NO GROWTH
Culture: NO GROWTH

## 2014-12-13 ENCOUNTER — Other Ambulatory Visit: Payer: Self-pay | Admitting: Urology

## 2014-12-13 NOTE — Patient Instructions (Addendum)
Joanna CrochetVickie J Huber  12/13/2014   Your procedure is scheduled on: 12/17/14   [facility] and follow signs to               Short Stay Center at 7:15 AM.   Call this number if you have problems the morning of surgery [phone]   Remember:  Do not eat food or drink liquids :After Midnight.     Take these medicines the morning of surgery with A SIP OF WATER: WELLBUTRIN / OMEPRAZOLE / EFFEXOR / TRAMADOL IF NEEDED                               You may not have any metal on your body including hair pins and              piercings  Do not wear jewelry, make-up, lotions, powders or perfumes.             Do not wear nail polish.  Do not shave  48 hours prior to surgery.              Men may shave face and neck.   Do not bring valuables to the hospital. [place] IS NOT             RESPONSIBLE   FOR VALUABLES.  Contacts, dentures or bridgework may not be worn into surgery.  Leave suitcase in the car. After surgery it may be brought to your room.     Patients discharged the day of surgery will not be allowed to drive home.  Name and phone number of your driver:  Special Instructions: N/A              Please read over the following fact sheets you were given: _____________________________________________________________________                                                     [place] - PREPARING FOR SURGERY  Before surgery, you can play an important role.  Because skin is not sterile, your skin needs to be as free of germs as possible.  You can reduce the number of germs on your skin by washing with CHG (chlorahexidine gluconate) soap before surgery.  CHG is an antiseptic cleaner which kills germs and bonds with the skin to continue killing germs even after washing. Please DO NOT use if you have an allergy to CHG or antibacterial soaps.  If your skin becomes reddened/irritated stop using the CHG and inform your nurse when you arrive at  Short Stay. Do not shave (including legs and underarms) for at least 48 hours prior to the first CHG shower.  You may shave your face. Please follow these instructions carefully:   1.  Shower with CHG Soap the night before surgery and the  morning of Surgery.   2.  If you choose to wash your hair, wash your hair first as usual with your  normal  Shampoo.   3.  After you shampoo, rinse your hair and body thoroughly to remove the  shampoo.  4.  Use CHG as you would any other liquid soap.  You can apply chg directly  to the skin and wash . Gently wash with scrungie or clean wascloth    5.  Apply the CHG Soap to your body ONLY FROM THE NECK DOWN.   Do not use on open                           Wound or open sores. Avoid contact with eyes, ears mouth and genitals (private parts).                        Genitals (private parts) with your normal soap.              6.  Wash thoroughly, paying special attention to the area where your surgery  will be performed.   7.  Thoroughly rinse your body with warm water from the neck down.   8.  DO NOT shower/wash with your normal soap after using and rinsing off  the CHG Soap .                9.  Pat yourself dry with a clean towel.             10.  Wear clean pajamas.             11.  Place clean sheets on your bed the night of your first shower and do not  sleep with pets.  Day of Surgery : Do not apply any lotions/deodorants the morning of surgery.  Please wear clean clothes to the hospital/surgery center.  FAILURE TO FOLLOW THESE INSTRUCTIONS MAY RESULT IN THE CANCELLATION OF YOUR SURGERY    PATIENT SIGNATURE_________________________________  ______________________________________________________________________

## 2014-12-13 NOTE — Progress Notes (Signed)
Called Dr. Lenoria Chimeahlstedt's office requested release of orders to Epic surgery 12-17-14 pre op 12-14-14 Thanks

## 2014-12-14 ENCOUNTER — Ambulatory Visit (HOSPITAL_COMMUNITY)
Admission: RE | Admit: 2014-12-14 | Discharge: 2014-12-14 | Disposition: A | Discharge status: Home / Self Care | Payer: 59 | Source: Ambulatory Visit | Attending: Anesthesiology | Admitting: Anesthesiology

## 2014-12-14 ENCOUNTER — Encounter (HOSPITAL_COMMUNITY)
Admission: RE | Admit: 2014-12-14 | Discharge: 2014-12-14 | Disposition: A | Discharge status: Home / Self Care | Payer: 59 | Source: Ambulatory Visit | Attending: Urology | Admitting: Urology

## 2014-12-14 ENCOUNTER — Emergency Department (HOSPITAL_COMMUNITY)
Admission: EM | Admit: 2014-12-14 | Discharge: 2014-12-14 | Disposition: A | Discharge status: Home / Self Care | Payer: 59 | Attending: Emergency Medicine | Admitting: Emergency Medicine

## 2014-12-14 ENCOUNTER — Emergency Department (HOSPITAL_COMMUNITY): Payer: 59

## 2014-12-14 ENCOUNTER — Encounter (HOSPITAL_COMMUNITY): Payer: Self-pay | Admitting: *Deleted

## 2014-12-14 ENCOUNTER — Encounter (HOSPITAL_COMMUNITY): Payer: Self-pay

## 2014-12-14 DIAGNOSIS — Z791 Long term (current) use of non-steroidal anti-inflammatories (NSAID): Secondary | ICD-10-CM | POA: Insufficient documentation

## 2014-12-14 DIAGNOSIS — F419 Anxiety disorder, unspecified: Secondary | ICD-10-CM | POA: Diagnosis not present

## 2014-12-14 DIAGNOSIS — N201 Calculus of ureter: Secondary | ICD-10-CM | POA: Insufficient documentation

## 2014-12-14 DIAGNOSIS — K219 Gastro-esophageal reflux disease without esophagitis: Secondary | ICD-10-CM | POA: Diagnosis not present

## 2014-12-14 DIAGNOSIS — Z8776 Personal history of (corrected) congenital malformations of integument, limbs and musculoskeletal system: Secondary | ICD-10-CM | POA: Diagnosis not present

## 2014-12-14 DIAGNOSIS — Z8669 Personal history of other diseases of the nervous system and sense organs: Secondary | ICD-10-CM | POA: Diagnosis not present

## 2014-12-14 DIAGNOSIS — Z87891 Personal history of nicotine dependence: Secondary | ICD-10-CM | POA: Diagnosis not present

## 2014-12-14 DIAGNOSIS — F329 Major depressive disorder, single episode, unspecified: Secondary | ICD-10-CM | POA: Insufficient documentation

## 2014-12-14 DIAGNOSIS — I10 Essential (primary) hypertension: Secondary | ICD-10-CM

## 2014-12-14 DIAGNOSIS — N200 Calculus of kidney: Secondary | ICD-10-CM | POA: Insufficient documentation

## 2014-12-14 DIAGNOSIS — Z79899 Other long term (current) drug therapy: Secondary | ICD-10-CM | POA: Insufficient documentation

## 2014-12-14 HISTORY — DX: Calculus of kidney: N20.0

## 2014-12-14 HISTORY — DX: Congenital malformation of skin, unspecified: Q82.9

## 2014-12-14 HISTORY — DX: Depression, unspecified: F32.A

## 2014-12-14 HISTORY — DX: Anxiety disorder, unspecified: F41.9

## 2014-12-14 HISTORY — DX: Sleep disorder, unspecified: G47.9

## 2014-12-14 HISTORY — DX: Major depressive disorder, single episode, unspecified: F32.9

## 2014-12-14 HISTORY — DX: Adverse effect of unspecified anesthetic, initial encounter: T41.45XA

## 2014-12-14 HISTORY — DX: Other specified postprocedural states: R11.2

## 2014-12-14 HISTORY — DX: Gastro-esophageal reflux disease without esophagitis: K21.9

## 2014-12-14 HISTORY — DX: Other specified postprocedural states: Z98.890

## 2014-12-14 HISTORY — DX: Other complications of anesthesia, initial encounter: T88.59XA

## 2014-12-14 LAB — BASIC METABOLIC PANEL
Anion gap: 7 (ref 5–15)
BUN: 12 mg/dL (ref 6–23)
CO2: 31 mmol/L (ref 19–32)
Calcium: 9.2 mg/dL (ref 8.4–10.5)
Chloride: 104 mmol/L (ref 96–112)
Creatinine, Ser: 0.84 mg/dL (ref 0.50–1.10)
GFR, EST NON AFRICAN AMERICAN: 79 mL/min — AB (ref >90)
Glucose, Bld: 117 mg/dL — ABNORMAL HIGH (ref 70–99)
POTASSIUM: 4 mmol/L (ref 3.5–5.1)
SODIUM: 142 mmol/L (ref 135–145)

## 2014-12-14 LAB — CBC
HCT: 41.6 % (ref 36.0–46.0)
Hemoglobin: 14.1 g/dL (ref 12.0–15.0)
MCH: 31.5 pg (ref 26.0–34.0)
MCHC: 33.9 g/dL (ref 30.0–36.0)
MCV: 92.9 fL (ref 78.0–100.0)
PLATELETS: 268 10*3/uL (ref 150–400)
RBC: 4.48 MIL/uL (ref 3.87–5.11)
RDW: 12 % (ref 11.5–15.5)
WBC: 8.4 10*3/uL (ref 4.0–10.5)

## 2014-12-14 MED ORDER — KETOROLAC TROMETHAMINE 30 MG/ML IJ SOLN
30.0000 mg | Freq: Once | INTRAMUSCULAR | Status: AC
  Administered 2014-12-14: 30 mg via INTRAVENOUS
  Filled 2014-12-14: qty 1

## 2014-12-14 MED ORDER — ONDANSETRON HCL 4 MG/2ML IJ SOLN
4.0000 mg | Freq: Once | INTRAMUSCULAR | Status: AC
  Administered 2014-12-14: 4 mg via INTRAVENOUS
  Filled 2014-12-14: qty 2

## 2014-12-14 MED ORDER — ONDANSETRON 8 MG PO TBDP
8.0000 mg | ORAL_TABLET | Freq: Three times a day (TID) | ORAL | Status: DC | PRN
Start: 2014-12-14 — End: 2017-08-10

## 2014-12-14 MED ORDER — HYDROMORPHONE HCL 2 MG PO TABS: 2.0000 mg | ORAL_TABLET | ORAL | Status: DC | PRN

## 2014-12-14 MED ORDER — FENTANYL CITRATE 0.05 MG/ML IJ SOLN
50.0000 ug | Freq: Once | INTRAMUSCULAR | Status: AC
  Administered 2014-12-14: 50 ug via INTRAVENOUS
  Filled 2014-12-14: qty 2

## 2014-12-14 NOTE — ED Notes (Signed)
Pt reports Dx with L sided kidney stones, pain since Monday. Saw Dahlsted today for pre op for lithotripsy on Monday. Pain became unbearable on the way home, so she came here. N/V x1 today.

## 2014-12-14 NOTE — Progress Notes (Signed)
   12/14/14 0905  OBSTRUCTIVE SLEEP APNEA  Have you ever been diagnosed with sleep apnea through a sleep study? No  Do you snore loudly (loud enough to be heard through closed doors)?  1  Do you often feel tired, fatigued, or sleepy during the daytime? 1  Has anyone observed you stop breathing during your sleep? 1  Do you have, or are you being treated for high blood pressure? 1  BMI more than 35 kg/m2? 1  Age over 52 years old? 1  Neck circumference greater than 40 cm/16 inches? 0  Gender: 0  Obstructive Sleep Apnea Score 6  Score 4 or greater  Results sent to PCP

## 2014-12-14 NOTE — Discharge Instructions (Signed)

## 2014-12-14 NOTE — ED Provider Notes (Signed)
CSN: [id number]     Arrival date & time 12/14/14  1055 History   First MD Initiated Contact with Patient 12/14/14 1143     Chief Complaint  Patient presents with  . Nephrolithiasis     (Consider location/radiation/quality/duration/timing/severity/associated sxs/prior Treatment) The history is provided by the patient.   patient with left-sided flank pain. Known ureteral stone. His been seen by urology with plan for a stenting and stone removal on Monday by Dr. Normajean Baxter. No fevers. She Seen in the neurology office today for preop testing. On the way home the pain became unbearable. She's had nausea vomiting. No diarrhea. No change in her urinary status. She has not eaten yet today.   Past Medical History  Diagnosis Date  . Hypertension   . Degenerative disc disease   . H/O sinus tachycardia   . Complication of anesthesia   . PONV (postoperative nausea and vomiting)   . Skin anomaly   . GERD (gastroesophageal reflux disease)   . Kidney stone   . Difficulty sleeping   . Anxiety   . Depression    Past Surgical History  Procedure Laterality Date  . Cervical fusion      x 2  . Cholecystectomy    . Tubal ligation    . Wrist surgery      cyst removed right wrist  . Breast enhancement surgery    . Ankle surgery      left  . Leg surgery      left leg   Family History  Problem Relation Age of Onset  . Hypertension Mother   . Diabetes Mother   . Heart disease Father   . Hypertension Father   . Hypertension Sister    History  Substance Use Topics  . Smoking status: Former Smoker    Types: Cigarettes    Quit date: 11/16/2008  . Smokeless tobacco: Not on file  . Alcohol Use: Yes     Comment: rare   OB History    No data available     Review of Systems  Constitutional: Negative for activity change and appetite change.  Eyes: Negative for pain.  Respiratory: Negative for chest tightness and shortness of breath.   Cardiovascular: Negative for chest pain and leg  swelling.  Gastrointestinal: Positive for nausea and vomiting. Negative for abdominal pain and diarrhea.  Genitourinary: Positive for flank pain. Negative for pelvic pain.  Musculoskeletal: Negative for back pain and neck stiffness.  Skin: Negative for rash.  Neurological: Negative for weakness, numbness and headaches.  Psychiatric/Behavioral: Negative for behavioral problems.      Allergies  Codeine; Hydrocodone; and Tramadol  Home Medications   Prior to Admission medications   Medication Sig Start Date End Date Taking? Authorizing Provider  bisoprolol-hydrochlorothiazide (ZIAC) 5-6.25 MG per tablet Take 1 tablet by mouth every morning.   Yes Historical Provider, MD  buPROPion (WELLBUTRIN XL) 300 MG 24 hr tablet Take 300 mg by mouth every morning.   Yes Historical Provider, MD  cetirizine (ZYRTEC) 10 MG tablet Take 10 mg by mouth daily as needed for allergies.   Yes Historical Provider, MD  cyclobenzaprine (FLEXERIL) 10 MG tablet Take 10 mg by mouth at bedtime as needed for muscle spasms.    Yes Historical Provider, MD  naproxen sodium (ANAPROX) 220 MG tablet Take 440 mg by mouth 2 (two) times daily as needed (pain).   Yes Historical Provider, MD  omeprazole (PRILOSEC OTC) 20 MG tablet Take 20 mg by mouth daily as needed (acid  reflux).   Yes Historical Provider, MD  venlafaxine (EFFEXOR) 37.5 MG tablet Take 37.5 mg by mouth every morning.   Yes Historical Provider, MD  HYDROcodone-acetaminophen (NORCO) 5-325 MG per tablet Take 1-2 tablets by mouth every 4 (four) hours as needed. Patient not taking: Reported on 12/14/2014 12/10/14   Enid SkeensJoshua M Zavitz, MD  HYDROmorphone (DILAUDID) 2 MG tablet Take 1 tablet (2 mg total) by mouth every 4 (four) hours as needed for moderate pain or severe pain. 12/14/14   Juliet RudeNathan R. Rubin PayorPickering, MD  ondansetron (ZOFRAN-ODT) 8 MG disintegrating tablet Take 1 tablet (8 mg total) by mouth every 8 (eight) hours as needed for nausea or vomiting. 12/14/14   Juliet RudeNathan R.  Adrionna Delcid, MD  traMADol (ULTRAM) 50 MG tablet Take 1 tablet (50 mg total) by mouth every 6 (six) hours as needed. Patient taking differently: Take 50 mg by mouth every 6 (six) hours as needed for moderate pain.  12/10/14   Enid SkeensJoshua M Zavitz, MD   BP 180/93 mmHg  Pulse 99  Temp(Src) 98.7 F (37.1 C) (Oral)  Resp 18  SpO2 94% Physical Exam  Constitutional: She is oriented to person, place, and time. She appears well-developed and well-nourished.  HENT:  Head: Normocephalic and atraumatic.  Cardiovascular: Normal rate, regular rhythm and normal heart sounds.   No murmur heard. Pulmonary/Chest: Effort normal and breath sounds normal. No respiratory distress.  Abdominal: Soft. Bowel sounds are normal. She exhibits no distension. There is no tenderness. There is no rebound and no guarding.  Genitourinary:  Some CVA tenderness on left.  Musculoskeletal: Normal range of motion.  Neurological: She is alert and oriented to person, place, and time. No cranial nerve deficit.  Skin: Skin is warm.  Psychiatric: Her speech is normal.  Nursing note and vitals reviewed.   ED Course  Procedures (including critical care time) Labs Review Labs Reviewed - No data to display  Imaging Review Dg Chest 2 View  12/14/2014   CLINICAL DATA:  Left kidney stone.  Hypertension.  EXAM: CHEST  2 VIEW  COMPARISON:  None.  FINDINGS: Mediastinum and hilar structures normal. Lungs are clear. Heart size normal. No pleural effusion or pneumothorax. Prior cervical spine fusion. Surgical clips right upper quadrant.  IMPRESSION: No active cardiopulmonary disease.   Electronically Signed   By: Maisie Fushomas  Register   On: 12/14/2014 09:57   Dg Abd 1 View  12/14/2014   CLINICAL DATA:  Significant increase in left-sided flank pain, history of known renal stones  EXAM: ABDOMEN - 1 VIEW  COMPARISON:  12/12/2014  FINDINGS: A left upper pole renal stone is again seen and stable. Additionally there is a proximal ureteral stone on the  left overlying the left transverse process at L3. The overall appearance is stable from the prior exam.  IMPRESSION: Stable left-sided renal and ureteral stones. No acute abnormality is noted.   Electronically Signed   By: Alcide CleverMark  Lukens M.D.   On: 12/14/2014 12:59     EKG Interpretation None      MDM   Final diagnoses:  Ureterolithiasis    Patient with ureteral stone on left. Plan for stenting and stone retrieval on Monday. Discussed with the patient's urologist, Dr Retta Dionesahlstedt. Pain is improved after treatment here. Good renal function. Doubt infection. Have increased pain medicines on her oral Dilaudid. KUB showed stones with similar positioning the patient's pain is manageable now. Will discharge home and she will return as needed.    Juliet RudeNathan R. Rubin PayorPickering, MD 12/14/14 [phone]

## 2014-12-16 NOTE — H&P (Signed)
H&P  Chief Complaint: kidney stone  History of Present Illness: [name] is a 54 y.o. year old female who presents at this time for ureteroscopic management of the significantly symptomatic left ureteral calculus.  She presented approximate 5 days ago withabout a week long history of intermittent left flank pain, nausea and vomiting.evaluation at that time revealed no significant distal movement of her stone.  She presented to the emergency room 3 days ago with recurrent pain.  She presents at this time for definitive management, as well as treatment of a remaining left renal stone.  Past Medical History  Diagnosis Date  . Hypertension   . Degenerative disc disease   . H/O sinus tachycardia   . Complication of anesthesia   . PONV (postoperative nausea and vomiting)   . Skin anomaly   . GERD (gastroesophageal reflux disease)   . Kidney stone   . Difficulty sleeping   . Anxiety   . Depression     Past Surgical History  Procedure Laterality Date  . Cervical fusion      x 2  . Cholecystectomy    . Tubal ligation    . Wrist surgery      cyst removed right wrist  . Breast enhancement surgery    . Ankle surgery      left  . Leg surgery      left leg    Home Medications:  Medications Prior to Admission  Medication Sig Dispense Refill  . bisoprolol-hydrochlorothiazide (ZIAC) 5-6.25 MG per tablet Take 1 tablet by mouth every morning.    Marland Kitchen. buPROPion (WELLBUTRIN XL) 300 MG 24 hr tablet Take 300 mg by mouth every morning.    . cetirizine (ZYRTEC) 10 MG tablet Take 10 mg by mouth daily as needed for allergies.    . cyclobenzaprine (FLEXERIL) 10 MG tablet Take 10 mg by mouth at bedtime as needed for muscle spasms.     Marland Kitchen. HYDROmorphone (DILAUDID) 2 MG tablet Take 1 tablet (2 mg total) by mouth every 4 (four) hours as needed for moderate pain or severe pain. 20 tablet 0  . naproxen sodium (ANAPROX) 220 MG tablet Take 440 mg by mouth 2 (two) times daily as needed (pain).    Marland Kitchen.  omeprazole (PRILOSEC OTC) 20 MG tablet Take 20 mg by mouth daily as needed (acid reflux).    . ondansetron (ZOFRAN-ODT) 8 MG disintegrating tablet Take 1 tablet (8 mg total) by mouth every 8 (eight) hours as needed for nausea or vomiting. 20 tablet 0  . traMADol (ULTRAM) 50 MG tablet Take 1 tablet (50 mg total) by mouth every 6 (six) hours as needed. (Patient taking differently: Take 50 mg by mouth every 6 (six) hours as needed for moderate pain. ) 15 tablet 0  . venlafaxine (EFFEXOR) 37.5 MG tablet Take 37.5 mg by mouth every morning.    Marland Kitchen. HYDROcodone-acetaminophen (NORCO) 5-325 MG per tablet Take 1-2 tablets by mouth every 4 (four) hours as needed. (Patient not taking: Reported on 12/14/2014) 20 tablet 0    Allergies:  Allergies  Allergen Reactions  . Codeine Anaphylaxis, Hives and Nausea Only  . Hydrocodone Hives and Nausea Only  . Tramadol Itching    Family History  Problem Relation Age of Onset  . Hypertension Mother   . Diabetes Mother   . Heart disease Father   . Hypertension Father   . Hypertension Sister     Social History:  reports that she quit smoking about 6 years ago. Her  smoking use included Cigarettes. She does not have any smokeless tobacco history on file. She reports that she drinks alcohol. She reports that she does not use illicit drugs.  ROS: Genitourinary, constitutional, skin, eye, otolaryngeal, hematologic/lymphatic, cardiovascular, pulmonary, endocrine, musculoskeletal, gastrointestinal, neurological and psychiatric system(s) were reviewed and pertinent findings if present are noted and are otherwise negative.  Genitourinary: nocturia, but no hematuria.  Gastrointestinal: nausea, vomiting and heartburn.  Constitutional: feeling tired (fatigue).  ENT: sinus problems.  Cardiovascular: leg swelling.  Endocrine: polydipsia.  Musculoskeletal: back pain and joint pain.  Psychiatric: depression and anxiety.   Physical Exam:  Vital signs in last 24 hours: Temp:   [99.2 F (37.3 C)-99.5 F (37.5 C)] 99.5 F (37.5 C) (02/01 0835) Pulse Rate:  [113] 113 (02/01 0718) Resp:  [20] 20 (02/01 0718) BP: (133)/(81) 133/81 mmHg (02/01 0718) SpO2:  [97 %] 97 % (02/01 0718) Weight:  [83.915 kg (185 lb)] 83.915 kg (185 lb) (02/01 0813) General:  Alert and oriented, No acute distress HEENT: Normocephalic, atraumatic Neck: No JVD or lymphadenopathy Cardiovascular: Regular rate and rhythm Lungs: Clear bilaterally Abdomen: Soft, nontender, nondistended, no abdominal masses Back: No CVA tenderness Extremities: No edema Neurologic: Grossly intact  Laboratory Data:  No results found for this or any previous visit (from the past 24 hour(s)). Recent Results (from the past 240 hour(s))  Urine culture     Status: None   Collection Time: 12/10/14 11:30 AM  Result Value Ref Range Status   Specimen Description URINE, CLEAN CATCH  Final   Special Requests NONE  Final   Colony Count NO GROWTH Performed at [facility]   Final   Culture NO GROWTH Performed at [facility]   Final   Report Status 12/11/2014 FINAL  Final   Creatinine:  Recent Labs  12/10/14 1055 12/14/14 0925  CREATININE 0.93 0.84    Radiologic Imaging: Kub Day Of Procedure  12/17/2014   CLINICAL DATA:  Left-sided renal calculi, pre lithotripsy.  EXAM: ABDOMEN - 1 VIEW  COMPARISON:  12/14/2014; 12/10/2014  FINDINGS: Unremarkable bowel gas pattern.  4 mm stone projects over the left kidney upper pole. 6 by 3 mm calculus projects over the left L3 transverse process tip. There is a known left mid to lower pole tiny calculus which is obscured by stool. Stool also obscures the known right-sided renal calculi.  IMPRESSION: 1. Similar position of the renal calculi compared to prior exams, including a 6 by 3 mm calculus in the left ureter and a 4 mm left kidney upper pole calculus.   Electronically Signed   By: Herbie Baltimore M.D.   On: 12/17/2014 08:16    Impression/Assessment:   Symptomatic left ureteral stone  Plan:  Cystoscopy, left retrograde ureteropyelogram, left ureteroscopy with holmium laser, extraction of stone, probable stent placement.  Chelsea Aus 12/17/2014, 8:54 AM  Bertram Millard. Regie Bunner MD

## 2014-12-17 ENCOUNTER — Encounter (HOSPITAL_COMMUNITY)
Admission: RE | Disposition: A | Discharge status: Home / Self Care | Payer: Self-pay | Source: Ambulatory Visit | Attending: Urology

## 2014-12-17 ENCOUNTER — Ambulatory Visit (HOSPITAL_COMMUNITY)
Admission: RE | Admit: 2014-12-17 | Discharge: 2014-12-17 | Disposition: A | Discharge status: Home / Self Care | Payer: 59 | Source: Ambulatory Visit | Attending: Urology | Admitting: Urology

## 2014-12-17 ENCOUNTER — Ambulatory Visit (HOSPITAL_COMMUNITY): Payer: 59

## 2014-12-17 ENCOUNTER — Encounter (HOSPITAL_COMMUNITY): Payer: Self-pay | Admitting: Certified Registered Nurse Anesthetist

## 2014-12-17 ENCOUNTER — Ambulatory Visit (HOSPITAL_COMMUNITY): Payer: 59 | Admitting: Certified Registered Nurse Anesthetist

## 2014-12-17 DIAGNOSIS — Z79899 Other long term (current) drug therapy: Secondary | ICD-10-CM | POA: Insufficient documentation

## 2014-12-17 DIAGNOSIS — K219 Gastro-esophageal reflux disease without esophagitis: Secondary | ICD-10-CM | POA: Diagnosis not present

## 2014-12-17 DIAGNOSIS — Z419 Encounter for procedure for purposes other than remedying health state, unspecified: Secondary | ICD-10-CM

## 2014-12-17 DIAGNOSIS — F329 Major depressive disorder, single episode, unspecified: Secondary | ICD-10-CM | POA: Diagnosis not present

## 2014-12-17 DIAGNOSIS — I10 Essential (primary) hypertension: Secondary | ICD-10-CM | POA: Insufficient documentation

## 2014-12-17 DIAGNOSIS — Z79891 Long term (current) use of opiate analgesic: Secondary | ICD-10-CM | POA: Insufficient documentation

## 2014-12-17 DIAGNOSIS — F419 Anxiety disorder, unspecified: Secondary | ICD-10-CM | POA: Insufficient documentation

## 2014-12-17 DIAGNOSIS — Z87442 Personal history of urinary calculi: Secondary | ICD-10-CM | POA: Insufficient documentation

## 2014-12-17 DIAGNOSIS — Z87891 Personal history of nicotine dependence: Secondary | ICD-10-CM | POA: Insufficient documentation

## 2014-12-17 DIAGNOSIS — N202 Calculus of kidney with calculus of ureter: Secondary | ICD-10-CM | POA: Diagnosis not present

## 2014-12-17 DIAGNOSIS — Z791 Long term (current) use of non-steroidal anti-inflammatories (NSAID): Secondary | ICD-10-CM | POA: Insufficient documentation

## 2014-12-17 DIAGNOSIS — Z6836 Body mass index (BMI) 36.0-36.9, adult: Secondary | ICD-10-CM | POA: Diagnosis not present

## 2014-12-17 DIAGNOSIS — N201 Calculus of ureter: Secondary | ICD-10-CM

## 2014-12-17 DIAGNOSIS — N200 Calculus of kidney: Secondary | ICD-10-CM

## 2014-12-17 HISTORY — PX: HOLMIUM LASER APPLICATION: SHX5852

## 2014-12-17 HISTORY — PX: CYSTOSCOPY WITH RETROGRADE PYELOGRAM, URETEROSCOPY AND STENT PLACEMENT: SHX5789

## 2014-12-17 SURGERY — CYSTOURETEROSCOPY, WITH RETROGRADE PYELOGRAM AND STENT INSERTION
Anesthesia: General | Laterality: Left

## 2014-12-17 MED ORDER — SODIUM CHLORIDE 0.9 % IJ SOLN
INTRAMUSCULAR | Status: AC
  Filled 2014-12-17: qty 10

## 2014-12-17 MED ORDER — ONDANSETRON HCL 4 MG/2ML IJ SOLN
INTRAMUSCULAR | Status: AC
  Filled 2014-12-17: qty 2

## 2014-12-17 MED ORDER — URIBEL 118 MG PO CAPS: 1.0000 | ORAL_CAPSULE | Freq: Three times a day (TID) | ORAL | Status: DC | PRN

## 2014-12-17 MED ORDER — CEFAZOLIN SODIUM-DEXTROSE 2-3 GM-% IV SOLR
INTRAVENOUS | Status: AC
  Filled 2014-12-17: qty 50

## 2014-12-17 MED ORDER — SCOPOLAMINE 1 MG/3DAYS TD PT72
MEDICATED_PATCH | TRANSDERMAL | Status: DC | PRN
  Administered 2014-12-17: 1 via TRANSDERMAL

## 2014-12-17 MED ORDER — ATROPINE SULFATE 0.4 MG/ML IJ SOLN
INTRAMUSCULAR | Status: AC
  Filled 2014-12-17: qty 1

## 2014-12-17 MED ORDER — PROPOFOL 10 MG/ML IV BOLUS
INTRAVENOUS | Status: AC
  Filled 2014-12-17: qty 20

## 2014-12-17 MED ORDER — GLYCOPYRROLATE 0.2 MG/ML IJ SOLN
INTRAMUSCULAR | Status: DC | PRN
  Administered 2014-12-17: 0.6 mg via INTRAVENOUS

## 2014-12-17 MED ORDER — FENTANYL CITRATE 0.05 MG/ML IJ SOLN: 25.0000 ug | INTRAMUSCULAR | Status: DC | PRN

## 2014-12-17 MED ORDER — LACTATED RINGERS IV SOLN
INTRAVENOUS | Status: DC | PRN
  Administered 2014-12-17 (×2): via INTRAVENOUS

## 2014-12-17 MED ORDER — LIDOCAINE HCL (CARDIAC) 20 MG/ML IV SOLN
INTRAVENOUS | Status: DC | PRN
  Administered 2014-12-17: 75 mg via INTRAVENOUS
  Administered 2014-12-17: 100 mg via INTRAVENOUS

## 2014-12-17 MED ORDER — ACETAMINOPHEN 10 MG/ML IV SOLN
1000.0000 mg | Freq: Once | INTRAVENOUS | Status: AC
  Administered 2014-12-17: 1000 mg via INTRAVENOUS
  Filled 2014-12-17: qty 100

## 2014-12-17 MED ORDER — PROPOFOL INFUSION 10 MG/ML OPTIME
INTRAVENOUS | Status: DC | PRN
  Administered 2014-12-17: 160 ug/kg/min via INTRAVENOUS

## 2014-12-17 MED ORDER — MIDAZOLAM HCL 2 MG/2ML IJ SOLN
INTRAMUSCULAR | Status: AC
  Filled 2014-12-17: qty 2

## 2014-12-17 MED ORDER — DEXAMETHASONE SODIUM PHOSPHATE 10 MG/ML IJ SOLN
INTRAMUSCULAR | Status: AC
  Filled 2014-12-17: qty 1

## 2014-12-17 MED ORDER — SODIUM CHLORIDE 0.9 % IR SOLN
Status: DC | PRN
  Administered 2014-12-17 (×2): 1000 mL

## 2014-12-17 MED ORDER — MIDAZOLAM HCL 5 MG/5ML IJ SOLN
INTRAMUSCULAR | Status: DC | PRN
  Administered 2014-12-17 (×2): 1 mg via INTRAVENOUS

## 2014-12-17 MED ORDER — ACETAMINOPHEN 325 MG PO TABS: 325.0000 mg | ORAL_TABLET | ORAL | Status: DC | PRN

## 2014-12-17 MED ORDER — ACETAMINOPHEN 160 MG/5ML PO SOLN
325.0000 mg | ORAL | Status: DC | PRN
  Filled 2014-12-17: qty 20.3

## 2014-12-17 MED ORDER — LIDOCAINE HCL (CARDIAC) 20 MG/ML IV SOLN
INTRAVENOUS | Status: AC
  Filled 2014-12-17: qty 5

## 2014-12-17 MED ORDER — CEPHALEXIN 500 MG PO CAPS: 500.0000 mg | ORAL_CAPSULE | Freq: Four times a day (QID) | ORAL | Status: DC

## 2014-12-17 MED ORDER — ESMOLOL HCL 10 MG/ML IV SOLN
INTRAVENOUS | Status: AC
  Filled 2014-12-17: qty 10

## 2014-12-17 MED ORDER — SUCCINYLCHOLINE CHLORIDE 20 MG/ML IJ SOLN
INTRAMUSCULAR | Status: DC | PRN
  Administered 2014-12-17: 100 mg via INTRAVENOUS

## 2014-12-17 MED ORDER — LIDOCAINE HCL (CARDIAC) 20 MG/ML IV SOLN
INTRAVENOUS | Status: AC
Start: 2014-12-17 — End: 2014-12-17
  Filled 2014-12-17: qty 5

## 2014-12-17 MED ORDER — DEXAMETHASONE SODIUM PHOSPHATE 10 MG/ML IJ SOLN
INTRAMUSCULAR | Status: DC | PRN
  Administered 2014-12-17: 10 mg via INTRAVENOUS

## 2014-12-17 MED ORDER — ONDANSETRON HCL 4 MG/2ML IJ SOLN
INTRAMUSCULAR | Status: DC | PRN
  Administered 2014-12-17 (×2): 2 mg via INTRAVENOUS

## 2014-12-17 MED ORDER — METOCLOPRAMIDE HCL 5 MG/ML IJ SOLN
INTRAMUSCULAR | Status: DC | PRN
  Administered 2014-12-17: 5 mg via INTRAVENOUS

## 2014-12-17 MED ORDER — FENTANYL CITRATE 0.05 MG/ML IJ SOLN
INTRAMUSCULAR | Status: AC
  Filled 2014-12-17: qty 5

## 2014-12-17 MED ORDER — PROMETHAZINE HCL 25 MG/ML IJ SOLN: 6.2500 mg | INTRAMUSCULAR | Status: DC | PRN

## 2014-12-17 MED ORDER — NEOSTIGMINE METHYLSULFATE 10 MG/10ML IV SOLN
INTRAVENOUS | Status: DC | PRN
  Administered 2014-12-17: 5 mg via INTRAVENOUS

## 2014-12-17 MED ORDER — BISOPROLOL FUMARATE 5 MG PO TABS
5.0000 mg | ORAL_TABLET | Freq: Once | ORAL | Status: DC
  Filled 2014-12-17: qty 1

## 2014-12-17 MED ORDER — IOHEXOL 300 MG/ML  SOLN
INTRAMUSCULAR | Status: DC | PRN
  Administered 2014-12-17: 7 mL

## 2014-12-17 MED ORDER — CEFAZOLIN SODIUM-DEXTROSE 2-3 GM-% IV SOLR
2.0000 g | INTRAVENOUS | Status: AC
  Administered 2014-12-17: 2 g via INTRAVENOUS

## 2014-12-17 MED ORDER — EPHEDRINE SULFATE 50 MG/ML IJ SOLN
INTRAMUSCULAR | Status: AC
  Filled 2014-12-17: qty 1

## 2014-12-17 MED ORDER — SCOPOLAMINE 1 MG/3DAYS TD PT72
MEDICATED_PATCH | TRANSDERMAL | Status: AC
  Filled 2014-12-17: qty 1

## 2014-12-17 MED ORDER — PROPOFOL 10 MG/ML IV BOLUS
INTRAVENOUS | Status: DC | PRN
  Administered 2014-12-17: 160 mg via INTRAVENOUS
  Administered 2014-12-17: 40 mg via INTRAVENOUS

## 2014-12-17 SURGICAL SUPPLY — 23 items
BAG URO CATCHER STRL LF (DRAPE) ×2 IMPLANT
BASKET ZERO TIP NITINOL 2.4FR (BASKET) ×1 IMPLANT
BSKT STON RTRVL ZERO TP 2.4FR (BASKET) ×1
CATH INTERMIT  6FR 70CM (CATHETERS) ×2 IMPLANT
CLOTH BEACON ORANGE TIMEOUT ST (SAFETY) ×2 IMPLANT
FIBER LASER FLEXIVA 1000 (UROLOGICAL SUPPLIES) IMPLANT
FIBER LASER FLEXIVA 200 (UROLOGICAL SUPPLIES) ×1 IMPLANT
FIBER LASER FLEXIVA 365 (UROLOGICAL SUPPLIES) IMPLANT
FIBER LASER FLEXIVA 550 (UROLOGICAL SUPPLIES) IMPLANT
FIBER LASER TRAC TIP (UROLOGICAL SUPPLIES) ×1 IMPLANT
GLOVE BIOGEL M 8.0 STRL (GLOVE) ×2 IMPLANT
GOWN STRL REUS W/TWL XL LVL3 (GOWN DISPOSABLE) ×2 IMPLANT
GUIDEWIRE ANG ZIPWIRE 038X150 (WIRE) ×1 IMPLANT
GUIDEWIRE STR DUAL SENSOR (WIRE) ×2 IMPLANT
KIT BALLIN UROMAX 15FX10 (LABEL) IMPLANT
MANIFOLD NEPTUNE II (INSTRUMENTS) ×2 IMPLANT
PACK CYSTO (CUSTOM PROCEDURE TRAY) ×2 IMPLANT
SET HIGH PRES BAL DIL (LABEL) ×1
SHEATH ACCESS URETERAL 38CM (SHEATH) ×2 IMPLANT
SHIELD EYE BINOCULAR (MISCELLANEOUS) ×1 IMPLANT
STENT URET 6FRX24 CONTOUR (STENTS) ×1 IMPLANT
TUBING CONNECTING 10 (TUBING) ×2 IMPLANT
WIRE COONS/BENSON .038X145CM (WIRE) IMPLANT

## 2014-12-17 NOTE — Transfer of Care (Signed)
Immediate Anesthesia Transfer of Care Note  Patient: [name]  Procedure(s) Performed: Procedure(s): CYSTOSCOPY WITH RETROGRADE PYELOGRAM, URETEROSCOPY,EXTRACTION OF STONES AND STENT PLACEMENT (Left) HOLMIUM LASER APPLICATION (Left)  Patient Location: PACU  Anesthesia Type:General  Level of Consciousness: awake, oriented, patient cooperative, lethargic and responds to stimulation  Airway & Oxygen Therapy: Patient Spontanous Breathing and Patient connected to face mask oxygen  Post-op Assessment: Report given to RN, Post -op Vital signs reviewed and stable and Patient moving all extremities  Post vital signs: Reviewed and stable  Last Vitals:  Filed Vitals:   12/17/14 0835  BP:   Pulse:   Temp: 37.5 C  Resp:     Complications: No apparent anesthesia complications

## 2014-12-17 NOTE — Anesthesia Postprocedure Evaluation (Signed)
  Anesthesia Post-op Note  Patient: [name]  Procedure(s) Performed: Procedure(s): CYSTOSCOPY WITH RETROGRADE PYELOGRAM, URETEROSCOPY,EXTRACTION OF STONES AND STENT PLACEMENT (Left) HOLMIUM LASER APPLICATION (Left)  Patient Location: PACU  Anesthesia Type:General  Level of Consciousness: awake  Airway and Oxygen Therapy: Patient Spontanous Breathing  Post-op Pain: none  Post-op Assessment: Post-op Vital signs reviewed, Patient's Cardiovascular Status Stable, Respiratory Function Stable, Patent Airway, No signs of Nausea or vomiting and Pain level controlled  Post-op Vital Signs: Reviewed and stable  Last Vitals:  Filed Vitals:   12/17/14 1239  BP: 120/60  Pulse: 92  Temp: 36.8 C  Resp: 18    Complications: No apparent anesthesia complications

## 2014-12-17 NOTE — Progress Notes (Signed)
Incentive spirometry sent home with patient and encouraged to use it. Patient verbalizes understanding and her husband too.

## 2014-12-17 NOTE — Discharge Instructions (Signed)
POSTOPERATIVE CARE AFTER URETEROSCOPY ° °Stent management ° °*Stents are often left in after ureteroscopy and stone treatment. If left in, they often cause urinary frequency, urgency, occasional blood in the urine, as well as flank discomfort with urination. These are all expected issues, and should resolve after the stent is removed. ° ° °Diet ° °Once you have adequately recovered from anesthesia, you may gradually advance your diet, as tolerated, to your regular diet. ° °Activities ° °You may gradually increase your activities to your normal unrestricted level the day following your procedure. ° °Medications ° °You should resume all preoperative medications. If you are on aspirin-like compounds, you should not resume these until the blood clears from your urine. If given an antibiotic by the surgeon, take these until they are completed. You may also be given, if you have a stent, medications to decrease the urinary frequency and urgency. ° °Pain ° °After ureteroscopy, there may be some pain on the side of the scope. Take your pain medicine for this. Usually, this pain resolves within a day or 2. ° °Fever ° °Please report any fever over 100° to the doctor. ° °

## 2014-12-17 NOTE — Anesthesia Procedure Notes (Signed)
Procedure Name: Intubation Date/Time: 12/17/2014 9:45 AM Performed by: Edison PaceGRAY, Edrie Ehrich E Pre-anesthesia Checklist: Patient identified, Timeout performed, Emergency Drugs available, Suction available and Patient being monitored Patient Re-evaluated:Patient Re-evaluated prior to inductionOxygen Delivery Method: Circle system utilized Preoxygenation: Pre-oxygenation with 100% oxygen Intubation Type: IV induction and Cricoid Pressure applied Ventilation: Two handed mask ventilation required Laryngoscope Size: Mac and 4 Grade View: Grade III Tube type: Oral Tube size: 7.5 mm Number of attempts: 2 Airway Equipment and Method: Bougie stylet Placement Confirmation: positive ETCO2 Tube secured with: Tape Dental Injury: Injury to lip  Difficulty Due To: Difficulty was anticipated, Difficult Airway- due to large tongue, Difficult Airway- due to reduced neck mobility, Difficult Airway- due to dentition, Difficult Airway- due to limited oral opening, Difficult Airway- due to anterior larynx and Difficult Airway- due to immobile epiglottis Future Recommendations: Recommend- induction with short-acting agent, and alternative techniques readily available Comments: 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 Maple HudsonMoser.  Arytenoids visualized only

## 2014-12-17 NOTE — Anesthesia Preprocedure Evaluation (Signed)
Anesthesia Evaluation  Patient identified by MRN, date of birth, ID band Patient awake    Reviewed: Allergy & Precautions, NPO status , Patient's Chart, lab work & pertinent test results  History of Anesthesia Complications (+) PONV and history of anesthetic complications  Airway Mallampati: IV  TM Distance: >3 FB Neck ROM: Full    Dental  (+) Teeth Intact   Pulmonary former smoker,  breath sounds clear to auscultation        Cardiovascular hypertension, Pt. on medications and Pt. on home beta blockers Rhythm:Regular     Neuro/Psych PSYCHIATRIC DISORDERS Anxiety Depression    GI/Hepatic GERD-  Medicated and Controlled,  Endo/Other  Morbid obesity  Renal/GU Renal disease     Musculoskeletal  (+) Arthritis -,   Abdominal   Peds  Hematology negative hematology ROS (+)   Anesthesia Other Findings   Reproductive/Obstetrics                             Anesthesia Physical Anesthesia Plan  ASA: II  Anesthesia Plan: General   Post-op Pain Management:    Induction: Intravenous  Airway Management Planned: LMA and Oral ETT  Additional Equipment: None  Intra-op Plan:   Post-operative Plan: Extubation in OR  Informed Consent: I have reviewed the patients History and Physical, chart, labs and discussed the procedure including the risks, benefits and alternatives for the proposed anesthesia with the patient or authorized representative who has indicated his/her understanding and acceptance.   Dental advisory given  Plan Discussed with: CRNA and Surgeon  Anesthesia Plan Comments:         Anesthesia Quick Evaluation

## 2014-12-18 ENCOUNTER — Encounter (HOSPITAL_COMMUNITY): Payer: Self-pay | Admitting: Urology

## 2014-12-24 NOTE — Op Note (Signed)
PATIENT:  [name]  PRE-OPERATIVE DIAGNOSIS: left proximal ureteral and renal calculi  POST-OPERATIVE DIAGNOSIS: Same  PROCEDURE: cystoscopy, left retrograde ureteral pyelogram withinterpretive fluoroscopy, dilation of left ureter (difficult), rigid andflexible left ureteroscopy, holmium laser application to left ureteral/renal calculi, stone extraction,double-J stent placement (6 [place] by 24 cmwithout string)  SURGEON:  Lillette Boxer. Fotini Lemus, M.D.  ANESTHESIA:  General  EBL:  Minimal  DRAINS: contour stent-24 cm x 6 [place]  LOCAL MEDICATIONS USED:  None  SPECIMEN:  Stone fragments, given to the patient's husband  INDICATION: [name] is a 54 year old female with a significantly symptomatic left upper ureteral stone.  In addition, she has a left renal stone.  She has been difficult to manage with pain medicine at home, and after a week of significant pain, presents at this time for ureteroscopic management.  The procedure of ureteroscopy, holmium laser and extraction of stone as well as stent placement have been discussed with the patient and she understands these and desires to proceed.  Description of procedure: The patient was properly identified and marked (if applicable) in the holding area. They were then  taken to the operating room and placed on the table in a supine position. General anesthesia was then administered. Once fully anesthetized the patient was moved to the dorsolithotomy position and the genitalia and perineum were sterilely prepped and draped in standard fashion. An official timeout was then performed.  A 22 French panendoscope was placed in the patient's bladder.  Bladder was found to be normal.  No tumors, trabeculations or foreign bodies were noted.  Ureteral orifices were normal in configuration and location.  The left ureter was cannulated with the 6 [place] open-ended catheter.  Gentle retrograde pyelogram was performed.  This revealed a normal distal  and mid ureter.  There was a filling defect in the proximal ureter consistent with the previously noted 6 mm stone.  Contrast did not significantly get by the obstructing stone.the remaining ureter was normal, however.    I negotiated a sensor tip guidewire through the open-ended catheter.  I was unable to negotiate this by the obstructing left proximal ureteral stone, despite multiple attempts.  I then negotiated a zip wire by this with moderate degree of difficulty.  After the wire past the stone and was seen coiled within the collecting system of the left kidney, I advanced the open-ended catheter over top of the wire.  Once by the stone, there was a hydronephrotic drip through the catheter.  I then removed a zip wire, and replaced the sensor tip guidewire.  Once I had the sensor tip guidewire adequately position, the scope was removed.  I then tried to negotiate the inner core of the 12/14 ureteral access catheter over top of the guidewire.  This would not progress up the distal ureter.  At this point, I felt that I needed to dilate the ureter with the 10 cm, 15 French UroMax balloon.  The ureter was sequentially dilated from distal to proximal.  Unfortunately, there remained a very tight area, despite 20 atm of pressure in the balloon, and the distal ureter.  I dilated this perhaps 3-4 times, each approximately 3 minutes.  There still remained this tight band about 2 cm up the ureter.  The remaining ureter was easily dilated.  I was unable to pass the ureteral access catheter, even the inner core pass this area.  I then used the rigid ureteroscope to negotiate past this area.  There was a stricture noted.  No significant trauma was noted, however.  I passed the scope proximally as far as I could.  This I felt would passively dilate the distal ureteral stricture.  As the scope passed up to the UPJ, it was evident that the previous noted stone was now back in the renal pelvis.  I could not identify the stone  with the rigid scope.  I backed the scope out, and then dilated the ureter more easily with the inner and then the entire ureteral access sheath.  The 57 French access was left in.  I then negotiated the flexible ureteroscope up through the access sheath and into the left renal pelvis.  Both previously noted stones were present.  One was quite dark, one more light in color.  I fragmented the stones into multiple smaller fragments with the application of laser energy through the 200  fiber.  Small stones were then extracted through the access sheath without difficulty, using the Nitinol basket.  Once no significant fragments were remaining, I removed the ureteroscope.  I passed the guidewire through the access sheath, and then removed the access sheath over the guidewire.  Using a cystoscope, I then passed a 24 cm x 6 French Contour stent over top of the guidewire using fluoroscopic and cystoscopic guidance.  Once adequately positioned, the wire was removed and good proximal and distal curls were seen.  No string was left on the stent.  The bladder was drained.  The scope was removed.  The patient was awakened and taken to the PACU in stable condition, having tolerated the procedure well.  Stone fragments were saved and brought to the patient's husband.     Marland Kitchen

## 2015-11-17 IMAGING — CR DG ABDOMEN 1V
1 series · 1 of 1 positions shown · non-contrast
Comparison: 12/14/2014; 12/10/2014

CLINICAL DATA: Left-sided renal calculi, pre lithotripsy.

EXAM:
ABDOMEN - 1 VIEW

[t abdomen supine]
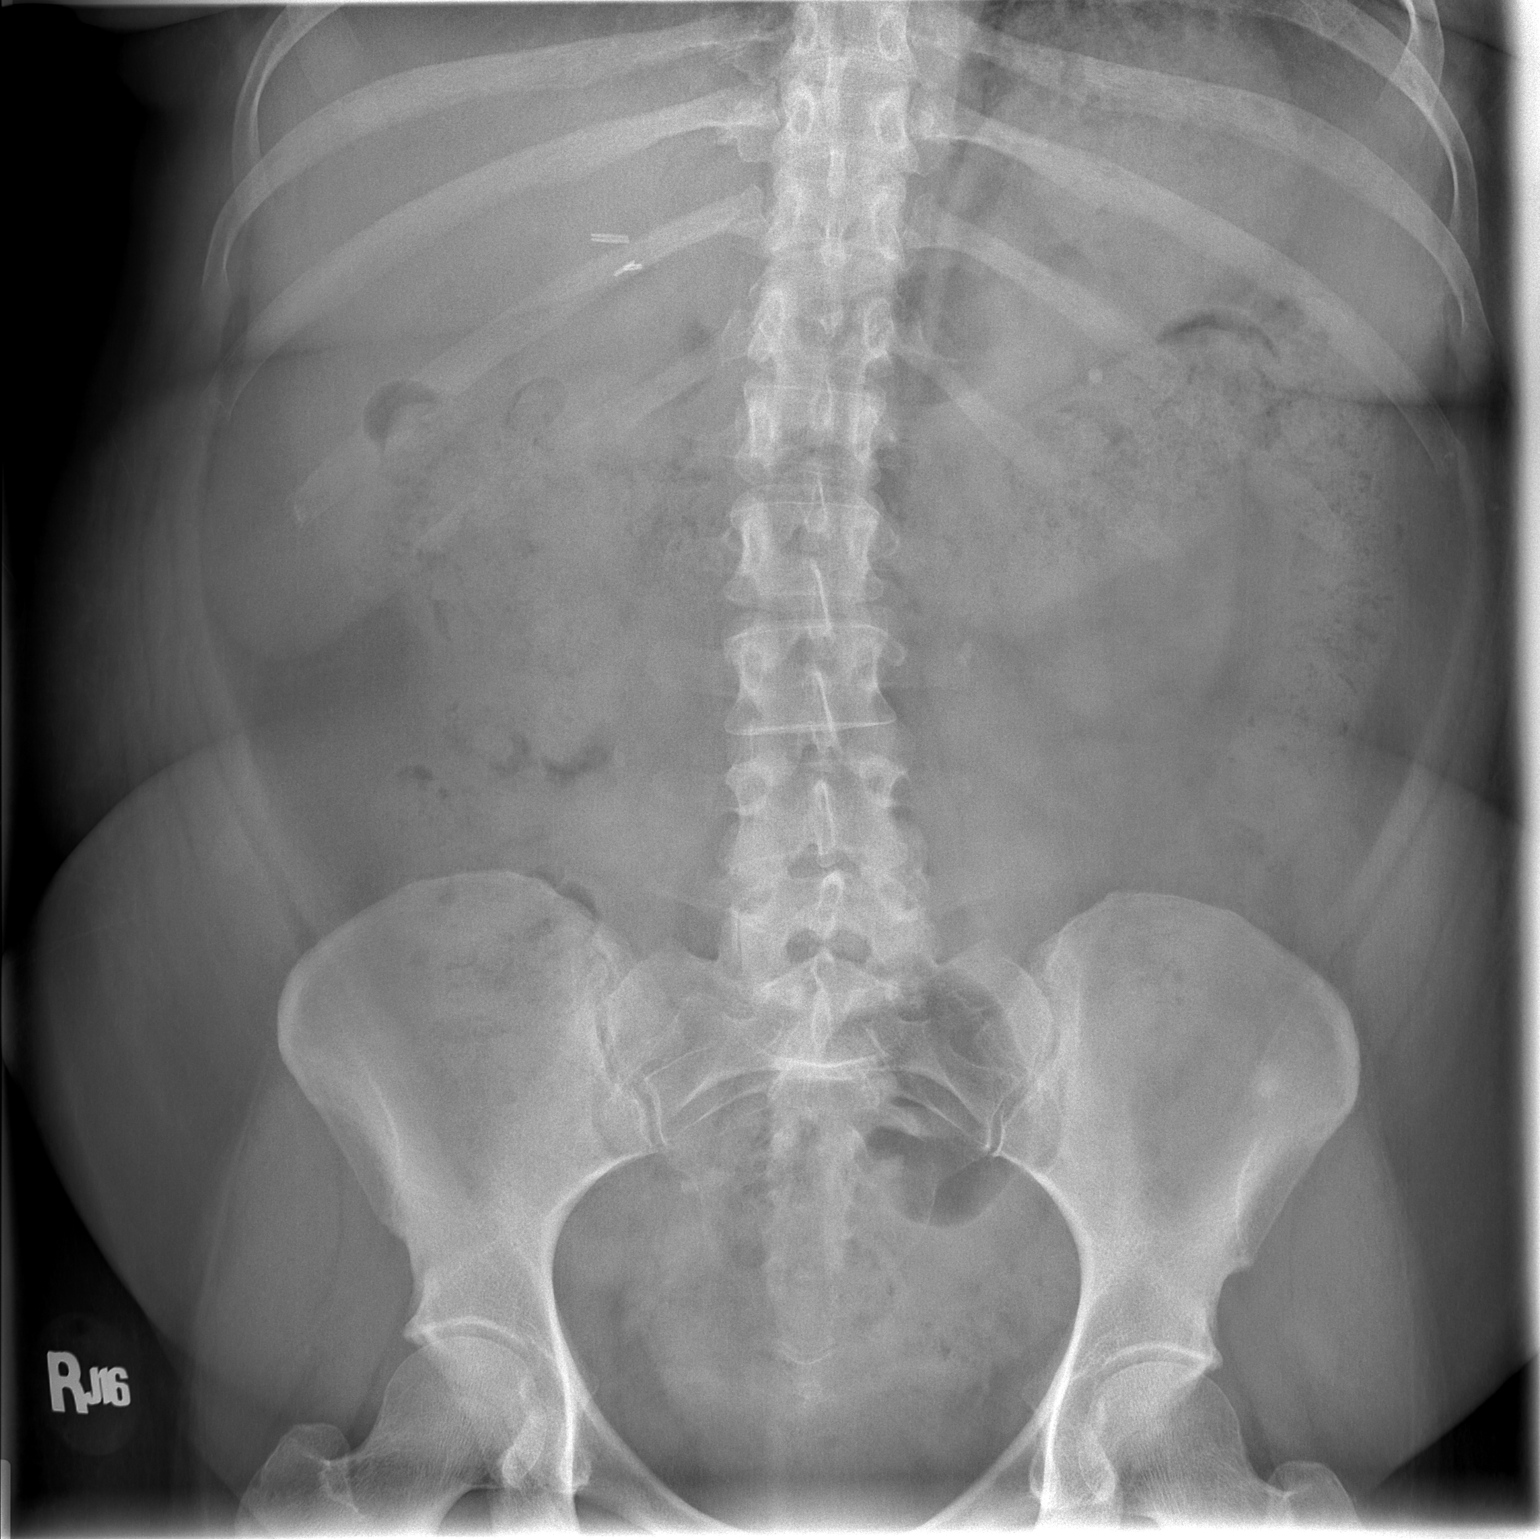

[1 of 1 positions shown; findings below may reference images not displayed]

FINDINGS: Unremarkable bowel gas pattern.

4 mm stone projects over the left kidney upper pole. 6 by 3 mm
calculus projects over the left L3 transverse process tip. There is
a known left mid to lower pole tiny calculus which is obscured by
stool. Stool also obscures the known right-sided renal calculi.
IMPRESSION: 1. Similar position of the renal calculi compared to prior exams,
including a 6 by 3 mm calculus in the left ureter and a 4 mm left
kidney upper pole calculus.

## 2017-08-10 ENCOUNTER — Ambulatory Visit (INDEPENDENT_AMBULATORY_CARE_PROVIDER_SITE_OTHER): Payer: BLUE CROSS/BLUE SHIELD | Admitting: Obstetrics and Gynecology

## 2017-08-10 ENCOUNTER — Encounter: Payer: Self-pay | Admitting: Obstetrics and Gynecology

## 2017-08-10 VITALS — BP 130/80 | HR 88 | Resp 18 | Ht 60.0 in | Wt 181.0 lb

## 2017-08-10 DIAGNOSIS — N83202 Unspecified ovarian cyst, left side: Secondary | ICD-10-CM | POA: Diagnosis not present

## 2017-08-10 DIAGNOSIS — Z8041 Family history of malignant neoplasm of ovary: Secondary | ICD-10-CM | POA: Diagnosis not present

## 2017-08-10 DIAGNOSIS — R102 Pelvic and perineal pain: Secondary | ICD-10-CM

## 2017-08-10 DIAGNOSIS — R109 Unspecified abdominal pain: Secondary | ICD-10-CM | POA: Diagnosis not present

## 2017-08-10 DIAGNOSIS — Z8049 Family history of malignant neoplasm of other genital organs: Secondary | ICD-10-CM

## 2017-08-10 DIAGNOSIS — Z780 Asymptomatic menopausal state: Secondary | ICD-10-CM | POA: Diagnosis not present

## 2017-08-10 NOTE — Progress Notes (Signed)
56 y.o. [id number] MarriedCaucasianF here for a consultation from Vianne Bulls, NP for evaluation of a left adnexal cyst. Recent CT for renal stones revealed a 4.3 x 6.2 x 3.2 cm slightly complex left adnexal cyst. Normal uterus and right adnexa. On review of her records in 2016 she was also noted to have a left adnexal cyst that measured 3.5 x 4.8 x 2.7 cm.  She does have intermittent LLQ abdominal pain for the last few years. The pain is sharp, like ovulatory pain. Some rectal pressure in association with the pain. The pain lasts for a couple of seconds at a time. Pain up to an 8/10 in severity. She can go up to a month without pain, then can have it come frequently for a week or two. Random.  Postmenopausal. No menses since she was 44. No bleeding at all. Not sexually active, husband has ED. Husband with colon cancer, has diabetes. He is in remission with his cancer.  She is currently dealing with diverticulitis and left sided kidney stone. Urologist feels she can clear it on her own. She gets severe flank pain with her stones, different than the LLQ pain.     No LMP recorded. Patient is postmenopausal.          Sexually active: No.  The current method of family planning is post menopausal status.    Exercising: No.  The patient does not participate in regular exercise at present. Smoker:  Former smoker   Health Maintenance: Pap:  2016 WNL per patient History of abnormal Pap:  no MMG:  unsure Colonoscopy:  2016 polyps repeat in 3 yrs BMD:   Never TDaP:  2015 Gardasil: N/A   reports that she quit smoking about 8 years ago. Her smoking use included Cigarettes. She has never used smokeless tobacco. She reports that she drinks alcohol. She reports that she does not use drugs.1-2 glasses of wine a month. She is on disability for degenerative disc disease, numbness in her right hand. She has chronic pain in her neck and both arms. She has pain in her left leg (has plates and screws).  2 kids, 35 and  32. 7 grand children. Everyone is local.   Past Medical History:  Diagnosis Date  . Anxiety   . Complication of anesthesia   . Degenerative disc disease   . Depression   . Diabetes mellitus without complication (HCC)   . Difficulty sleeping   . Fatty liver   . GERD (gastroesophageal reflux disease)   . H/O sinus tachycardia   . History of ovarian cyst   . Hypertension   . Kidney stone   . PONV (postoperative nausea and vomiting)   . Restless leg syndrome   . Skin anomaly   . Type 2 diabetes mellitus (HCC)     Past Surgical History:  Procedure Laterality Date  . ANKLE SURGERY     left  . BREAST ENHANCEMENT SURGERY    . CARPAL TUNNEL RELEASE    . CERVICAL FUSION     x 2  . CHOLECYSTECTOMY    . CYSTOSCOPY WITH RETROGRADE PYELOGRAM, URETEROSCOPY AND STENT PLACEMENT Left 12/17/2014   Procedure: CYSTOSCOPY WITH RETROGRADE PYELOGRAM, URETEROSCOPY,EXTRACTION OF STONES AND STENT PLACEMENT;  Surgeon: Chelsea Aus, MD;  Location: [facility];  Service: Urology;  Laterality: Left;  . HOLMIUM LASER APPLICATION Left 12/17/2014   Procedure: HOLMIUM LASER APPLICATION;  Surgeon: Chelsea Aus, MD;  Location: [facility];  Service: Urology;  Laterality: Left;  . LEG SURGERY  left leg  . NECK SURGERY     X 2  . TUBAL LIGATION    . WRIST SURGERY     cyst removed right wrist    Current Outpatient Prescriptions  Medication Sig Dispense Refill  . atorvastatin (LIPITOR) 10 MG tablet Take by mouth.    . bisoprolol-hydrochlorothiazide (ZIAC) 5-6.25 MG per tablet Take 1 tablet by mouth every morning.    Marland Kitchen buPROPion (WELLBUTRIN XL) 300 MG 24 hr tablet Take 300 mg by mouth every morning.    . cetirizine (ZYRTEC) 10 MG tablet Take 10 mg by mouth daily as needed for allergies.    . furosemide (LASIX) 20 MG tablet TAKE ONE TABLET BY MOUTH 2 TIMES DAILY.    Marland Kitchen gabapentin (NEURONTIN) 300 MG capsule TAKE ONE CAPSULE BY MOUTH AT BEDTIME AS NEEDED.    . metFORMIN (GLUCOPHAGE-XR) 500 MG 24 hr tablet  Take by mouth.    Marland Kitchen omeprazole (PRILOSEC OTC) 20 MG tablet Take 40 mg by mouth daily as needed (acid reflux).     Marland Kitchen rOPINIRole (REQUIP) 0.25 MG tablet Take by mouth.    . venlafaxine (EFFEXOR) 37.5 MG tablet Take 37.5 mg by mouth every morning.     No current facility-administered medications for this visit.     Family History  Problem Relation Age of Onset  . Hypertension Mother   . Diabetes Mother   . Heart disease Father   . Hypertension Father   . Hypertension Sister   . Cervical cancer Sister   . Ovarian cancer Maternal Aunt   . Uterine cancer Maternal Aunt   Mom has 2 sisters, one with ovarian cancer in her mid 40's, deceased. Other Aunt with uterine cancer in her late 47's early 12's. No other family history of breast, uterine, ovarian or colon cancer.   Review of Systems  Constitutional: Negative.   HENT: Negative.   Eyes: Negative.   Respiratory: Negative.   Cardiovascular: Negative.   Gastrointestinal: Negative.   Endocrine: Negative.   Genitourinary: Positive for pelvic pain.       LLQ pain  Musculoskeletal: Negative.   Skin: Negative.   Allergic/Immunologic: Negative.   Neurological: Negative.   Psychiatric/Behavioral: Negative.     Exam:   BP 130/80 (BP Location: Right Arm, Patient Position: Sitting, Cuff Size: Normal)   Pulse 88   Resp 18   Ht 5' (1.524 m)   Wt 181 lb (82.1 kg)   BMI 35.35 kg/m   Weight change: @ Height:   Height: 5' (152.4 cm)  Ht Readings from Last 3 Encounters:  08/10/17 5' (1.524 m)  12/17/14 5' (1.524 m)  12/14/14 5' (1.524 m)    General appearance: alert, cooperative and appears stated age Head: Normocephalic, without obvious abnormality, atraumatic Neck: no adenopathy, supple, symmetrical, trachea midline and thyroid normal to inspection and palpation Lungs: clear to auscultation bilaterally Cardiovascular: regular rate and rhythm Abdomen: soft, mildly tender LLQ, no rebound, no garding; non distended,  no  masses,  no organomegaly Extremities: extremities normal, atraumatic, no cyanosis or edema Skin: Skin color, texture, turgor normal. No rashes or lesions Lymph nodes: Cervical, supraclavicular, and axillary nodes normal. No abnormal inguinal nodes palpated Neurologic: Grossly normal   Pelvic: External genitalia:  no lesions              Urethra:  normal appearing urethra with no masses, tenderness or lesions              Bartholins and Skenes: normal  Vagina: normal appearing atrophic vagina with normal color and discharge, no lesions              Cervix: no cervical motion tenderness and no lesions               Bimanual Exam:  Uterus:  normal size, contour, position, consistency, mobility, non-tender              Adnexa: no masses, tender bilateral, didn't tolerate palpation well               Rectovaginal: deferred, too uncomfortable with bimanual exam.  Chaperone was present for exam.  A:  Complex left adnexal cyst, increasing in size  Abdominal/pelvic pain  Family history of ovarian cancer  Family history of uterine cancer   P:   Return for gyn ultrasound  CA 125  Discussed likely need for laparoscopy and BSO (given family history and 12 years PMP, would recommend BSO)  Declines genetic counseling at this time  CC: Vianne Bulls, NP Letter sent

## 2017-08-11 ENCOUNTER — Telehealth: Payer: Self-pay | Admitting: Obstetrics and Gynecology

## 2017-08-11 ENCOUNTER — Telehealth: Payer: Self-pay | Admitting: *Deleted

## 2017-08-11 LAB — CA 125: Cancer Antigen (CA) 125: 12.8 U/mL (ref 0.0–38.1)

## 2017-08-11 NOTE — Telephone Encounter (Signed)
Left message to call regarding results -eh 

## 2017-08-11 NOTE — Telephone Encounter (Signed)
Spoke with patient regarding benefit for ultrasound. Patient understood and agreeable. Patient ready to schedule. Patient scheduled 08/17/17 with Dr Oscar La. Patient aware of appointment date, arrival time and cancellation policy. No further questions. Ok to close

## 2017-08-11 NOTE — Telephone Encounter (Signed)
Call placed to patient to review benefits for a recommended ultrasound. Left voicemail message requesting a return call. °

## 2017-08-11 NOTE — Telephone Encounter (Signed)
Spoke with patient and gave results -eh  

## 2017-08-11 NOTE — Telephone Encounter (Signed)
Patient returning call.

## 2017-08-11 NOTE — Telephone Encounter (Signed)
-----   Message from Romualdo Bolk, MD sent at 08/11/2017 10:42 AM EDT ----- Please advise the patient of normal results. She is worried, please call her today.

## 2017-08-17 ENCOUNTER — Encounter: Payer: Self-pay | Admitting: Obstetrics and Gynecology

## 2017-08-17 ENCOUNTER — Ambulatory Visit (INDEPENDENT_AMBULATORY_CARE_PROVIDER_SITE_OTHER): Payer: BLUE CROSS/BLUE SHIELD | Admitting: Obstetrics and Gynecology

## 2017-08-17 ENCOUNTER — Ambulatory Visit (INDEPENDENT_AMBULATORY_CARE_PROVIDER_SITE_OTHER): Payer: BLUE CROSS/BLUE SHIELD

## 2017-08-17 VITALS — BP 110/60 | HR 84 | Resp 14 | Wt 182.0 lb

## 2017-08-17 DIAGNOSIS — R109 Unspecified abdominal pain: Secondary | ICD-10-CM

## 2017-08-17 DIAGNOSIS — Z8041 Family history of malignant neoplasm of ovary: Secondary | ICD-10-CM

## 2017-08-17 DIAGNOSIS — Z8049 Family history of malignant neoplasm of other genital organs: Secondary | ICD-10-CM

## 2017-08-17 DIAGNOSIS — Z780 Asymptomatic menopausal state: Secondary | ICD-10-CM

## 2017-08-17 DIAGNOSIS — N840 Polyp of corpus uteri: Secondary | ICD-10-CM

## 2017-08-17 DIAGNOSIS — R102 Pelvic and perineal pain: Secondary | ICD-10-CM

## 2017-08-17 DIAGNOSIS — N83202 Unspecified ovarian cyst, left side: Secondary | ICD-10-CM | POA: Diagnosis not present

## 2017-08-17 DIAGNOSIS — N7011 Chronic salpingitis: Secondary | ICD-10-CM | POA: Diagnosis not present

## 2017-08-17 NOTE — Progress Notes (Signed)
GYNECOLOGY  VISIT   HPI: 56 y.o.   Married  Caucasian  female   G2P2002 with No LMP recorded. Patient is postmenopausal.   here for follow up  LLQ pain and mass on CT scan. She had a normal CA 125 last week  GYNECOLOGIC HISTORY: No LMP recorded. Patient is postmenopausal. Contraception:postmenopause  Menopausal hormone therapy: none  04/17/15: normal pap at [facility]        OB History    Gravida Para Term Preterm AB Living   SAB TAB Ectopic Multiple Live Births           2         Patient Active Problem List   Diagnosis Date Noted  . Degenerative disc disease   . H/O sinus tachycardia     Past Medical History:  Diagnosis Date  . Anxiety   . Complication of anesthesia   . Degenerative disc disease   . Depression   . Diabetes mellitus without complication (HCC)   . Difficulty sleeping   . Fatty liver   . GERD (gastroesophageal reflux disease)   . H/O sinus tachycardia   . History of ovarian cyst   . Hypertension   . Kidney stone   . PONV (postoperative nausea and vomiting)   . Restless leg syndrome   . Skin anomaly   . Type 2 diabetes mellitus (HCC)     Past Surgical History:  Procedure Laterality Date  . ANKLE SURGERY     left  . BREAST ENHANCEMENT SURGERY    . CARPAL TUNNEL RELEASE    . CERVICAL FUSION     x 2  . CHOLECYSTECTOMY    . CYSTOSCOPY WITH RETROGRADE PYELOGRAM, URETEROSCOPY AND STENT PLACEMENT Left 12/17/2014   Procedure: CYSTOSCOPY WITH RETROGRADE PYELOGRAM, URETEROSCOPY,EXTRACTION OF STONES AND STENT PLACEMENT;  Surgeon: Chelsea Aus, MD;  Location: [facility];  Service: Urology;  Laterality: Left;  . HOLMIUM LASER APPLICATION Left 12/17/2014   Procedure: HOLMIUM LASER APPLICATION;  Surgeon: Chelsea Aus, MD;  Location: [facility];  Service: Urology;  Laterality: Left;  . LEG SURGERY     left leg  . NECK SURGERY     X 2  . TUBAL LIGATION    . WRIST SURGERY     cyst removed right wrist    Current Outpatient  Prescriptions  Medication Sig Dispense Refill  . atorvastatin (LIPITOR) 10 MG tablet Take by mouth.    . bisoprolol-hydrochlorothiazide (ZIAC) 5-6.25 MG per tablet Take 1 tablet by mouth every morning.    Marland Kitchen buPROPion (WELLBUTRIN XL) 300 MG 24 hr tablet Take 300 mg by mouth every morning.    . cetirizine (ZYRTEC) 10 MG tablet Take 10 mg by mouth daily as needed for allergies.    . furosemide (LASIX) 20 MG tablet TAKE ONE TABLET BY MOUTH 2 TIMES DAILY.    Marland Kitchen gabapentin (NEURONTIN) 300 MG capsule TAKE ONE CAPSULE BY MOUTH AT BEDTIME AS NEEDED.    . metFORMIN (GLUCOPHAGE-XR) 500 MG 24 hr tablet Take by mouth.    Marland Kitchen omeprazole (PRILOSEC OTC) 20 MG tablet Take 40 mg by mouth daily as needed (acid reflux).     Marland Kitchen rOPINIRole (REQUIP) 0.25 MG tablet Take by mouth.    . venlafaxine (EFFEXOR) 37.5 MG tablet Take 37.5 mg by mouth every morning.     No current facility-administered medications for this visit.      ALLERGIES: Codeine; Hydrocodone;  and Tramadol  Family History  Problem Relation Age of Onset  . Hypertension Mother   . Diabetes Mother   . Heart disease Father   . Hypertension Father   . Hypertension Sister   . Cervical cancer Sister   . Ovarian cancer Maternal Aunt   . Uterine cancer Maternal Aunt     Social History   Social History  . Marital status: Married    Spouse name: N/A  . Number of children: N/A  . Years of education: N/A   Occupational History  . Not on file.   Social History Main Topics  . Smoking status: Former Smoker    Types: Cigarettes    Quit date: 11/16/2008  . Smokeless tobacco: Never Used  . Alcohol use Yes     Comment: 2 per month  . Drug use: No  . Sexual activity: No   Other Topics Concern  . Not on file   Social History Narrative  . No narrative on file    Review of Systems  Constitutional: Negative.   HENT: Negative.   Eyes: Negative.   Respiratory: Negative.   Cardiovascular: Negative.   Gastrointestinal: Negative.   Genitourinary:        LLQ pain   Musculoskeletal: Negative.   Skin: Negative.   Neurological: Negative.   Endo/Heme/Allergies: Negative.   Psychiatric/Behavioral: Negative.     PHYSICAL EXAMINATION:    BP 110/60 (BP Location: Right Arm, Patient Position: Sitting, Cuff Size: Normal)   Pulse 84   Resp 14   Wt 182 lb (82.6 kg)   BMI 35.54 kg/m     General appearance: alert, cooperative and appears stated age  Reviewed ultrasound images with the patient. Recommend hysteroscopy, polypectomy. Discussed that the adnexal cyst is c/w a hydrosalpinx, doesn't have to be removed, but could be the source of her pain. She desires removal.   ASSESSMENT Endometrial polyp Left hydrosalpinx LLQ abdominal/pelvic pain Family history of uterine and ovarian cancer    PLAN Hysteroscopy, polypectomy, D&C Diagnostic laparoscopy, discussed option of bilateral salpingectomy vs BSO. She understands the risks and benefits of BSO. Given her family history she desires BSO She is aware that removal of her left hydrosalpinx may or may not fix her LLQ abdominal/pelvic pain   An After Visit Summary was printed and given to the patient.  10-15 minutes face to face time of which over 50% was spent in counseling.   CC: Vianne Bulls, NP Billie Ruddy, RN

## 2017-08-24 ENCOUNTER — Telehealth: Payer: Self-pay | Admitting: Obstetrics and Gynecology

## 2017-08-24 NOTE — Telephone Encounter (Signed)
Call placed to patient to review benefits for a recommended surgical procedures. Left voicemail message requesting a return call.   cc: Billie Ruddy, RN

## 2017-08-31 NOTE — Telephone Encounter (Signed)
Second call placed to patient to review benefits for recommended surgical procedures. Left voicemail message requesting a return call.   cc: Billie Ruddy, RN

## 2017-09-07 NOTE — Telephone Encounter (Signed)
Third call placed to patient to review benefits for recommended surgical procedures. Left voicemail message requesting a return call   cc: Billie RuddySally Yeakley, RN

## 2017-09-15 NOTE — Telephone Encounter (Signed)
No response from patient X 3. Call to patient regarding plans for surgery.  Per DPR can leave message on voice mail. Left message that we have been unable to reach her and calling to check on her pain and plans for proceeding with surgery. Requested call back to clinical supervisor, Kennon RoundsSally, at earliest convenience.    Routing to Dr Oscar LaJertson for review.

## 2017-09-16 NOTE — Telephone Encounter (Signed)
I would recommend that she have surgery for her postmenopausal endometrial polyp and pelvic mass. While both of these areas are likely benign, there is no way to know for sure without surgery. Please send the patient a letter, telling her to call if and when she is ready for surgery. Also see if there is anything we can do to help, or if she wants a referral else where. Then please close the encounter

## 2017-09-20 ENCOUNTER — Encounter: Payer: Self-pay | Admitting: *Deleted

## 2017-09-20 NOTE — Telephone Encounter (Signed)
Letter to your office for review. 

## 2023-02-11 ENCOUNTER — Other Ambulatory Visit: Payer: Self-pay | Admitting: Neurological Surgery

## 2023-02-19 NOTE — Pre-Procedure Instructions (Signed)
Surgical Instructions    Your procedure is scheduled on Tuesday, March 02, 2023 at 7:30 AM.  Report to [facility] Main Entrance "A" at 5:30 A.M., then check in with the Admitting office.  Call this number if you have problems the morning of surgery:  [phone]   If you have any questions prior to your surgery date call [phone]: Open Monday-Friday 8am-4pm  *If you experience any cold or flu symptoms such as cough, fever, chills, shortness of breath, etc. between now and your scheduled surgery, please notify us.*    Remember:  Do not eat or drink after midnight the night before your surgery    Take these medicines the morning of surgery with A SIP OF WATER:  atorvastatin (LIPITOR)  buPROPion (WELLBUTRIN XL)  rOPINIRole (REQUIP)  venlafaxine (EFFEXOR)   IF NEEDED: cetirizine (ZYRTEC)  gabapentin (NEURONTIN)  omeprazole (PRILOSEC OTC)   As of today, STOP taking any Aspirin (unless otherwise instructed by your surgeon) Aleve, Naproxen, Ibuprofen, Motrin, Advil, Goody's, BC's, all herbal medications, fish oil, and all vitamins.  WHAT DO I DO ABOUT MY DIABETES MEDICATION?   Do not take metFORMIN (GLUCOPHAGE-XR) the morning of surgery.   HOW TO MANAGE YOUR DIABETES BEFORE AND AFTER SURGERY  Why is it important to control my blood sugar before and after surgery? Improving blood sugar levels before and after surgery helps healing and can limit problems. A way of improving blood sugar control is eating a healthy diet by:  Eating less sugar and carbohydrates  Increasing activity/exercise  Talking with your doctor about reaching your blood sugar goals High blood sugars (greater than 180 mg/dL) can raise your risk of infections and slow your recovery, so you will need to focus on controlling your diabetes during the weeks before surgery. Make sure that the doctor who takes care of your diabetes knows about your planned surgery including the date and location.  How do I  manage my blood sugar before surgery? Check your blood sugar at least 4 times a day, starting 2 days before surgery, to make sure that the level is not too high or low.  Check your blood sugar the morning of your surgery when you wake up and every 2 hours until you get to the Short Stay unit.  If your blood sugar is less than 70 mg/dL, you will need to treat for low blood sugar: Do not take insulin. Treat a low blood sugar (less than 70 mg/dL) with  cup of clear juice (cranberry or apple), 4 glucose tablets, OR glucose gel. Recheck blood sugar in 15 minutes after treatment (to make sure it is greater than 70 mg/dL). If your blood sugar is not greater than 70 mg/dL on recheck, call [phone] for further instructions. Report your blood sugar to the short stay nurse when you get to Short Stay.  If you are admitted to the hospital after surgery: Your blood sugar will be checked by the staff and you will probably be given insulin after surgery (instead of oral diabetes medicines) to make sure you have good blood sugar levels. The goal for blood sugar control after surgery is 80-180 mg/dL.            Do NOT Smoke (Tobacco/Vaping) for 24 hours prior to your procedure.  If you use a CPAP at night, you may bring your mask/headgear for your overnight stay.   Contacts, glasses, piercing's, hearing aid's, dentures or partials may not be worn into surgery, please bring cases for these  belongings.    For patients admitted to the hospital, discharge time will be determined by your treatment team.   Patients discharged the day of surgery will not be allowed to drive home, and someone needs to stay with them for 24 hours.  SURGICAL WAITING ROOM VISITATION Patients having surgery or a procedure may have 2 support people in the waiting area. Visitors may stay in the waiting area during the procedure and switch out with other visitors if needed. Only 1 support person is allowed in the pre-op area with the  patient AFTER the patient is prepped. This person cannot be switched out.  Children under the age of 60 must have an adult accompany them who is not the patient. If the patient needs to stay at the hospital during part of their recovery, the visitor guidelines for inpatient rooms apply.  Please refer to the [facility] website for the visitor guidelines for Inpatients (after your surgery is over and you are in a regular room).    Oral Hygiene is also important to reduce your risk of infection.  Remember - BRUSH YOUR TEETH THE MORNING OF SURGERY WITH YOUR REGULAR TOOTHPASTE   *PLEASE SEE ATTACHED SHEET FOR CHG SOAP INSTRUCTIONS.*   Day of Surgery: Take a shower with CHG soap. Do not wear lotions, powders, perfumes/colognes, or deodorant. Do not wear jewelry or makeup Do not shave 48 hours prior to surgery. Do not wear nail polish, gel polish, artificial nails, or any other type of covering on natural nails (fingers and toes) If you have artificial nails or gel coating that need to be removed by a nail salon, please have this removed prior to surgery. Artificial nails or gel coating may interfere with anesthesia's ability to adequately monitor your vital signs. Wear Clean/Comfortable clothing the morning of surgery Do not bring valuables to the hospital.  [facility] is not responsible for any belongings or valuables. Remember to brush your teeth WITH YOUR REGULAR TOOTHPASTE.   Please read over the following fact sheets that you were given.  If you received a COVID test during your pre-op visit  it is requested that you wear a mask when out in public, stay away from anyone that may not be feeling well and notify your surgeon if you develop symptoms. If you have been in contact with anyone that has tested positive in the last 10 days please notify you surgeon.

## 2023-02-22 ENCOUNTER — Inpatient Hospital Stay (HOSPITAL_COMMUNITY)
Admission: RE | Admit: 2023-02-22 | Discharge: 2023-02-22 | Disposition: A | Discharge status: Home / Self Care | Payer: 59 | Source: Ambulatory Visit

## 2023-02-26 ENCOUNTER — Inpatient Hospital Stay (HOSPITAL_COMMUNITY): Admission: RE | Admit: 2023-02-26 | Payer: 59 | Source: Ambulatory Visit

## 2023-03-02 ENCOUNTER — Encounter (HOSPITAL_COMMUNITY): Admission: RE | Payer: Self-pay | Source: Home / Self Care

## 2023-03-02 ENCOUNTER — Inpatient Hospital Stay (HOSPITAL_COMMUNITY): Admission: RE | Admit: 2023-03-02 | Payer: Medicare Other | Source: Home / Self Care | Admitting: Neurological Surgery

## 2023-03-02 SURGERY — ANTERIOR CERVICAL DECOMPRESSION/DISCECTOMY FUSION 1 LEVEL/HARDWARE REMOVAL
Anesthesia: General
